# Patient Record
Sex: Male | Born: 1990 | Race: Black or African American | Hispanic: No | Marital: Single | Smoking: Never smoker
Health system: Southern US, Community
[De-identification: ages and names within clinical notes are randomized; demographics above are authoritative.]

## PROBLEM LIST (undated history)

## (undated) DIAGNOSIS — Z969 Presence of functional implant, unspecified: Secondary | ICD-10-CM

## (undated) DIAGNOSIS — J45909 Unspecified asthma, uncomplicated: Secondary | ICD-10-CM

## (undated) SURGERY — Surgical Case
Anesthesia: *Unknown

---
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
## 2008-05-25 ENCOUNTER — Emergency Department (HOSPITAL_COMMUNITY): Admission: EM | Admit: 2008-05-25 | Discharge: 2008-05-25 | Payer: Self-pay | Admitting: Emergency Medicine

## 2009-03-09 ENCOUNTER — Emergency Department (HOSPITAL_COMMUNITY): Admission: EM | Admit: 2009-03-09 | Discharge: 2009-03-09 | Payer: Self-pay | Admitting: Emergency Medicine

## 2009-08-01 ENCOUNTER — Emergency Department (HOSPITAL_COMMUNITY): Admission: EM | Admit: 2009-08-01 | Discharge: 2009-08-01 | Payer: Self-pay | Admitting: Emergency Medicine

## 2010-07-09 LAB — CULTURE, ROUTINE-ABSCESS

## 2011-12-27 ENCOUNTER — Encounter (HOSPITAL_COMMUNITY): Payer: Self-pay | Admitting: Emergency Medicine

## 2011-12-27 ENCOUNTER — Emergency Department (INDEPENDENT_AMBULATORY_CARE_PROVIDER_SITE_OTHER): Payer: Self-pay

## 2011-12-27 ENCOUNTER — Emergency Department (INDEPENDENT_AMBULATORY_CARE_PROVIDER_SITE_OTHER)
Admission: EM | Admit: 2011-12-27 | Discharge: 2011-12-27 | Disposition: A | Discharge status: Home / Self Care | Payer: Self-pay | Source: Home / Self Care | Attending: Emergency Medicine | Admitting: Emergency Medicine

## 2011-12-27 DIAGNOSIS — S62309A Unspecified fracture of unspecified metacarpal bone, initial encounter for closed fracture: Secondary | ICD-10-CM

## 2011-12-27 DIAGNOSIS — S62307A Unspecified fracture of fifth metacarpal bone, left hand, initial encounter for closed fracture: Secondary | ICD-10-CM

## 2011-12-27 HISTORY — DX: Unspecified asthma, uncomplicated: J45.909

## 2011-12-27 MED ORDER — HYDROCODONE-ACETAMINOPHEN 5-325 MG PO TABS: 2.0000 | ORAL_TABLET | ORAL | Status: DC | PRN

## 2011-12-27 MED ORDER — IBUPROFEN 800 MG PO TABS: 800.0000 mg | ORAL_TABLET | Freq: Three times a day (TID) | ORAL | Status: DC | PRN

## 2011-12-27 MED ORDER — IBUPROFEN 800 MG PO TABS
800.0000 mg | ORAL_TABLET | Freq: Once | ORAL | Status: AC
  Administered 2011-12-27: 800 mg via ORAL

## 2011-12-27 MED ORDER — IBUPROFEN 800 MG PO TABS
ORAL_TABLET | ORAL | Status: AC
  Filled 2011-12-27: qty 1

## 2011-12-27 NOTE — ED Provider Notes (Signed)
History     CSN: [id number]  Arrival date & time 12/27/11  1424   First MD Initiated Contact with Patient 12/27/11 1424      Chief Complaint  Patient presents with  . Hand Injury    (Consider location/radiation/quality/duration/timing/severity/associated sxs/prior treatment) HPI Comments: Right-handed male states he punched a wall with his left hand earlier today, now reports pain, swelling, bruising lateral left hand at the fifth MCP joint. Reports difficulty fully extending his little finger due to pain. No nausea, vomiting, numbness. He is not a smoker or diabetic.  ROS as noted in HPI. All other ROS negative.   Patient is a 21 y.o. male presenting with hand injury. The history is provided by the patient. No language interpreter was used.  Hand Injury  The incident occurred 3 to 5 hours ago. The incident occurred at home. The injury mechanism was a direct blow. The pain is present in the left hand. The quality of the pain is described as aching and throbbing. The pain is moderate. The pain has been constant since the incident. Pertinent negatives include no fever. He reports no foreign bodies present. The symptoms are aggravated by movement and use. He has tried nothing for the symptoms. The treatment provided no relief.    Past Medical History  Diagnosis Date  . Asthma     History reviewed. No pertinent past surgical history.  History reviewed. No pertinent family history.  History  Substance Use Topics  . Smoking status: Never Smoker   . Smokeless tobacco: Not on file  . Alcohol Use: Yes      Review of Systems  Constitutional: Negative for fever.    Allergies  Review of patient's allergies indicates no known allergies.  Home Medications   Current Outpatient Rx  Name Route Sig Dispense Refill  . HYDROCODONE-ACETAMINOPHEN 5-325 MG PO TABS Oral Take 2 tablets by mouth every 4 (four) hours as needed for pain. 20 tablet 0  . IBUPROFEN 800 MG PO TABS Oral Take 1  tablet (800 mg total) by mouth every 8 (eight) hours as needed for pain or fever. 20 tablet 0    BP 132/82  Pulse 80  Temp 99.3 F (37.4 C) (Oral)  Resp 18  SpO2 98%  Physical Exam  Nursing note and vitals reviewed. Constitutional: He is oriented to person, place, and time. He appears well-developed and well-nourished.  HENT:  Head: Normocephalic and atraumatic.  Eyes: Conjunctivae normal and EOM are normal.  Neck: Normal range of motion.  Cardiovascular: Normal rate.   Pulmonary/Chest: Effort normal. No respiratory distress.  Abdominal: He exhibits no distension.  Musculoskeletal: Normal range of motion.       No rotational deformity little finger. Bruising, swelling, fourth and fifth MCP joints. Unable to fully extend little finger due to pain. Proximal, middle, distal phalanx little finger nontender, FDP/FDS intact against resistance. distal motor and sensation in median/radial/ulnar nerve distribution with CR< 2 secs and pulse intact.   2-point discrimination intact at 5mm in all fingers. Skin intact. Wrist WNL.   Neurological: He is alert and oriented to person, place, and time. Coordination normal.  Skin: Skin is warm and dry.  Psychiatric: He has a normal mood and affect. His behavior is normal. Judgment and thought content normal.    ED Course  Procedures (including critical care time)  Labs Reviewed - No data to display Dg Hand Complete Left  12/27/2011  *RADIOLOGY REPORT*  Clinical Data: Punching injury  LEFT HAND - COMPLETE 3+  VIEW  Comparison: None.  Findings: Three views of the left hand submitted.  There is mild displaced angulated fracture distal aspect left fifth metacarpal. There is adjacent soft tissue swelling.  IMPRESSION: Mild displaced fracture distal aspect left fifth metacarpal.   Original Report Authenticated By: Natasha Mead, M.D.      1. Fracture of fifth metacarpal bone of left hand      MDM  X-ray reviewed by myself. Boxer's fracture. Full report per  radiologist. Pt NVI today. Discussed imaging findings the patient. Placing on a gutter splint, pain medicine, will have him followup with Dr. Izora Ribas, hand on call within the week. Emphasized importance of followup. Discussed signs and symptoms that should prompt has returned to the department. Patient agrees with plan.  Luiz Blare, MD 12/27/11 [phone]

## 2011-12-27 NOTE — ED Notes (Signed)
Pt is here c/o left hand inj.... Was upset/mad and punched a wooden door.... Sx include: limited movement, swelling, pain.

## 2011-12-27 NOTE — Progress Notes (Signed)
Orthopedic Tech Progress Note Patient Details:  Michael Hood 25-Jan-1991 [id number]  Ortho Devices Type of Ortho Device: Ulna gutter splint Ortho Device/Splint Location: left hand Ortho Device/Splint Interventions: Application   Suellen Durocher 12/27/2011, 4:18 PM

## 2011-12-27 NOTE — ED Notes (Signed)
Ortho Tech is here applying the Ulnar Gutter.

## 2011-12-27 NOTE — ED Notes (Signed)
Pt was preoccupied with phone call; EMT will return when pt is no longer on the phone to obtain vital signs.

## 2011-12-27 NOTE — ED Notes (Signed)
Went in to do assessment but pt was on his cell phone... Asked pt to hang the phone up but kept on talking... Will try later

## 2012-01-01 ENCOUNTER — Other Ambulatory Visit: Payer: Self-pay | Admitting: Orthopedic Surgery

## 2012-01-01 ENCOUNTER — Encounter (HOSPITAL_BASED_OUTPATIENT_CLINIC_OR_DEPARTMENT_OTHER): Payer: Self-pay | Admitting: *Deleted

## 2012-01-05 ENCOUNTER — Encounter (HOSPITAL_BASED_OUTPATIENT_CLINIC_OR_DEPARTMENT_OTHER): Payer: Self-pay | Admitting: Orthopedic Surgery

## 2012-01-05 ENCOUNTER — Ambulatory Visit (HOSPITAL_BASED_OUTPATIENT_CLINIC_OR_DEPARTMENT_OTHER)
Admission: RE | Admit: 2012-01-05 | Discharge: 2012-01-05 | Disposition: A | Discharge status: Home / Self Care | Payer: 59 | Source: Ambulatory Visit | Attending: Orthopedic Surgery | Admitting: Orthopedic Surgery

## 2012-01-05 ENCOUNTER — Ambulatory Visit (HOSPITAL_BASED_OUTPATIENT_CLINIC_OR_DEPARTMENT_OTHER): Payer: 59 | Admitting: Anesthesiology

## 2012-01-05 ENCOUNTER — Encounter (HOSPITAL_BASED_OUTPATIENT_CLINIC_OR_DEPARTMENT_OTHER)
Admission: RE | Disposition: A | Discharge status: Home / Self Care | Payer: Self-pay | Source: Ambulatory Visit | Attending: Orthopedic Surgery

## 2012-01-05 ENCOUNTER — Encounter (HOSPITAL_BASED_OUTPATIENT_CLINIC_OR_DEPARTMENT_OTHER): Payer: Self-pay | Admitting: Anesthesiology

## 2012-01-05 DIAGNOSIS — Y92009 Unspecified place in unspecified non-institutional (private) residence as the place of occurrence of the external cause: Secondary | ICD-10-CM | POA: Insufficient documentation

## 2012-01-05 DIAGNOSIS — S62309A Unspecified fracture of unspecified metacarpal bone, initial encounter for closed fracture: Secondary | ICD-10-CM | POA: Insufficient documentation

## 2012-01-05 DIAGNOSIS — J45909 Unspecified asthma, uncomplicated: Secondary | ICD-10-CM | POA: Insufficient documentation

## 2012-01-05 DIAGNOSIS — F17200 Nicotine dependence, unspecified, uncomplicated: Secondary | ICD-10-CM | POA: Insufficient documentation

## 2012-01-05 DIAGNOSIS — W2209XA Striking against other stationary object, initial encounter: Secondary | ICD-10-CM | POA: Insufficient documentation

## 2012-01-05 HISTORY — PX: PERCUTANEOUS PINNING METACARPAL FRACTURE: SUR206

## 2012-01-05 LAB — POCT HEMOGLOBIN-HEMACUE: Hemoglobin: 14 g/dL (ref 13.0–17.0)

## 2012-01-05 SURGERY — CLOSED REDUCTION, FRACTURE, METACARPAL BONE, WITH PERCUTANEOUS PINNING
Anesthesia: General | Site: Hand | Laterality: Left | Wound class: Clean

## 2012-01-05 MED ORDER — CHLORHEXIDINE GLUCONATE 4 % EX LIQD
60.0000 mL | Freq: Once | CUTANEOUS | Status: AC
  Administered 2012-01-05: 4 via TOPICAL

## 2012-01-05 MED ORDER — MIDAZOLAM HCL 2 MG/2ML IJ SOLN
1.0000 mg | INTRAMUSCULAR | Status: DC | PRN
  Administered 2012-01-05: 2 mg via INTRAVENOUS

## 2012-01-05 MED ORDER — CHLORHEXIDINE GLUCONATE 4 % EX LIQD: 60.0000 mL | Freq: Once | CUTANEOUS | Status: DC

## 2012-01-05 MED ORDER — FENTANYL CITRATE 0.05 MG/ML IJ SOLN
50.0000 ug | INTRAMUSCULAR | Status: DC | PRN
  Administered 2012-01-05: 100 ug via INTRAVENOUS

## 2012-01-05 MED ORDER — CEFAZOLIN SODIUM-DEXTROSE 2-3 GM-% IV SOLR
2.0000 g | INTRAVENOUS | Status: AC
  Administered 2012-01-05: 2 g via INTRAVENOUS

## 2012-01-05 MED ORDER — LACTATED RINGERS IV SOLN
INTRAVENOUS | Status: DC
  Administered 2012-01-05 (×2): via INTRAVENOUS

## 2012-01-05 MED ORDER — BUPIVACAINE-EPINEPHRINE PF 0.5-1:200000 % IJ SOLN
INTRAMUSCULAR | Status: DC | PRN
  Administered 2012-01-05: 150 mg

## 2012-01-05 MED ORDER — PROPOFOL 10 MG/ML IV BOLUS
INTRAVENOUS | Status: DC | PRN
  Administered 2012-01-05: 200 mg via INTRAVENOUS

## 2012-01-05 MED ORDER — OXYCODONE-ACETAMINOPHEN 5-325 MG PO TABS: 1.0000 | ORAL_TABLET | ORAL | Status: DC | PRN

## 2012-01-05 MED ORDER — FENTANYL CITRATE 0.05 MG/ML IJ SOLN
INTRAMUSCULAR | Status: DC | PRN
  Administered 2012-01-05: 25 ug via INTRAVENOUS

## 2012-01-05 MED ORDER — DEXAMETHASONE SODIUM PHOSPHATE 4 MG/ML IJ SOLN
INTRAMUSCULAR | Status: DC | PRN
  Administered 2012-01-05: 10 mg via INTRAVENOUS

## 2012-01-05 SURGICAL SUPPLY — 46 items
BANDAGE GAUZE ELAST BULKY 4 IN (GAUZE/BANDAGES/DRESSINGS) ×2 IMPLANT
BLADE MINI RND TIP GREEN BEAV (BLADE) IMPLANT
BLADE SURG 15 STRL LF DISP TIS (BLADE) IMPLANT
BLADE SURG 15 STRL SS (BLADE)
BNDG COHESIVE 3X5 TAN STRL LF (GAUZE/BANDAGES/DRESSINGS) ×2 IMPLANT
BNDG ESMARK 4X9 LF (GAUZE/BANDAGES/DRESSINGS) IMPLANT
CHLORAPREP W/TINT 26ML (MISCELLANEOUS) ×2 IMPLANT
CLOTH BEACON ORANGE TIMEOUT ST (SAFETY) ×2 IMPLANT
CORDS BIPOLAR (ELECTRODE) IMPLANT
COVER MAYO STAND STRL (DRAPES) ×2 IMPLANT
COVER TABLE BACK 60X90 (DRAPES) ×2 IMPLANT
CUFF TOURNIQUET SINGLE 18IN (TOURNIQUET CUFF) ×2 IMPLANT
DECANTER SPIKE VIAL GLASS SM (MISCELLANEOUS) IMPLANT
DRAPE EXTREMITY T 121X128X90 (DRAPE) ×2 IMPLANT
DRAPE OEC MINIVIEW 54X84 (DRAPES) ×2 IMPLANT
DRAPE SURG 17X23 STRL (DRAPES) ×2 IMPLANT
GAUZE XEROFORM 1X8 LF (GAUZE/BANDAGES/DRESSINGS) ×2 IMPLANT
GLOVE BIO SURGEON STRL SZ 6.5 (GLOVE) ×4 IMPLANT
GLOVE BIOGEL PI IND STRL 8.5 (GLOVE) ×1 IMPLANT
GLOVE BIOGEL PI INDICATOR 8.5 (GLOVE) ×1
GLOVE SURG ORTHO 8.0 STRL STRW (GLOVE) ×2 IMPLANT
GOWN BRE IMP PREV XXLGXLNG (GOWN DISPOSABLE) ×2 IMPLANT
GOWN PREVENTION PLUS XLARGE (GOWN DISPOSABLE) ×4 IMPLANT
NEEDLE 27GAX1X1/2 (NEEDLE) IMPLANT
NS IRRIG 1000ML POUR BTL (IV SOLUTION) IMPLANT
PACK BASIN DAY SURGERY FS (CUSTOM PROCEDURE TRAY) ×2 IMPLANT
PAD CAST 3X4 CTTN HI CHSV (CAST SUPPLIES) ×1 IMPLANT
PADDING CAST ABS 4INX4YD NS (CAST SUPPLIES) ×1
PADDING CAST ABS COTTON 4X4 ST (CAST SUPPLIES) ×1 IMPLANT
PADDING CAST COTTON 3X4 STRL (CAST SUPPLIES) ×1
SLEEVE SCD COMPRESS KNEE MED (MISCELLANEOUS) IMPLANT
SPLINT PLASTER CAST XFAST 3X15 (CAST SUPPLIES) IMPLANT
SPLINT PLASTER XTRA FASTSET 3X (CAST SUPPLIES)
SPONGE GAUZE 4X4 12PLY (GAUZE/BANDAGES/DRESSINGS) ×2 IMPLANT
STOCKINETTE 4X48 STRL (DRAPES) ×2 IMPLANT
SUT CHROMIC 5 0 P 3 (SUTURE) IMPLANT
SUT MERSILENE 4 0 P 3 (SUTURE) IMPLANT
SUT VICRYL 4-0 PS2 18IN ABS (SUTURE) IMPLANT
SUT VICRYL RAPID 5 0 P 3 (SUTURE) IMPLANT
SUT VICRYL RAPIDE 4/0 PS 2 (SUTURE) ×2 IMPLANT
SYR BULB 3OZ (MISCELLANEOUS) IMPLANT
SYR CONTROL 10ML LL (SYRINGE) IMPLANT
TOWEL OR 17X24 6PK STRL BLUE (TOWEL DISPOSABLE) ×2 IMPLANT
UNDERPAD 30X30 INCONTINENT (UNDERPADS AND DIAPERS) ×2 IMPLANT
WATER STERILE IRR 1000ML POUR (IV SOLUTION) IMPLANT
small bone fixation (Nail) ×2 IMPLANT

## 2012-01-05 NOTE — Anesthesia Postprocedure Evaluation (Signed)
Anesthesia Post Note  Patient: Michael Hood  Procedure(s) Performed: Procedure(s) (LRB): CLOSED REDUCTION METACARPAL WITH PERCUTANEOUS PINNING (Left)  Anesthesia type: general  Patient location: PACU  Post pain: Pain level controlled  Post assessment: Patient's Cardiovascular Status Stable  Last Vitals:  Filed Vitals:   01/05/12 1700  BP:   Pulse: 79  Temp:   Resp: 13    Post vital signs: Reviewed and stable  Level of consciousness: sedated  Complications: No apparent anesthesia complications

## 2012-01-05 NOTE — H&P (Signed)
  Michael Hood is a 21 year old right hand dominant male who punched a door on 12-27-11 suffering an injury to his right small finger metacarpal. He has no prior history of injury. He was seen at the ER where x-rays were taken. He was placed in an ulnar gutter splint and given pain medication to take and referred. No history of diabetes, thyroid problems, arthritis or gout. He complains of an intermittent severe, sharp, throbbing type pain which is improving. Activity makes it worse and rest makes it better. He has been taking ibuprofen and Hydrocodone.   PAST MEDICAL HISTORY: He has no allergies. He is currently taking no medications. He has had no surgery.  FAMILY H ISTORY: Positive for arthritis, otherwise negative.  SOCIAL HISTORY: He does not smoke. He drinks socially. 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 Michael Hood is an 21 y.o. male.   Chief Complaint: Fracture 5th metacarpal left HPI:  See above  Past Medical History  Diagnosis Date  . Asthma     as a child  . Fracture, metacarpal 12/27/2011    left 5th    Past Surgical History  Procedure Date  . No past surgeries     History reviewed. No pertinent family history. Social History:  reports that he has never smoked. He has never used smokeless tobacco. He reports that he drinks alcohol. He reports that he does not use illicit drugs.  Allergies: No Known Allergies  No prescriptions prior to admission    No results found for this or any previous visit (from the past 48 hour(s)).  No results found.   Pertinent items are noted in HPI.  Height 5\' 8"  (1.727 m), weight 65.772 kg (145 lb).  General appearance: alert, cooperative and appears stated age Head: Normocephalic, without obvious abnormality Neck: no adenopathy Resp: clear to auscultation bilaterally Cardio: regular rate and rhythm, S1, S2 normal, no murmur, click, rub or  gallop GI: soft, non-tender; bowel sounds normal; no masses,  no organomegaly Extremities: extremities normal, atraumatic, no cyanosis or edema Pulses: 2+ and symmetric Skin: Skin color, texture, turgor normal. No rashes or lesions Neurologic: Grossly normal Incision/Wound: na  Assessment/Plan X-rays reveal a 5th metacarpal neck fracture with approximately 45-50 degrees of angulation.   We have discussed closed vs open treatment for him. He has elected to undergo surgical intervention with closed, possible ORIF. We will attempt to put an IM rod in this in an effort to see if this will resolve this for him. He is advised that pins may be necessary or even ORIF. The pre, peri and post op course are discussed along with risks and complications.  He is aware there is no guarantee with surgery, possibility of infection, recurrence, injury to arteries, nerves and tendons, incomplete relief of symptoms, nonunion, loss of fixation and dystrophy. He would like to proceed and this will be scheduled as an outpatient for closed possible ORIF left 5th metacarpal fracture.  Michael Hood R 01/05/2012, 2:11 PM

## 2012-01-05 NOTE — Brief Op Note (Signed)
01/05/2012  4:19 PM  PATIENT:  Michael Hood  21 y.o. male  PRE-OPERATIVE DIAGNOSIS:  Fracture 5TH METACARPAL LEFT   POST-OPERATIVE DIAGNOSIS:  fracture 5th metacarpal left  PROCEDURE:  Procedure(s) (LRB) with comments: CLOSED REDUCTION METACARPAL WITH PERCUTANEOUS PINNING (Left) - CLOSED PINNING LEFT 5TH METACARPAL  SURGEON:  Surgeon(s) and Role:    * Nicki Reaper, MD - Primary  PHYSICIAN ASSISTANT:   ASSISTANTS: none   ANESTHESIA:   regional and general  EBL:     BLOOD ADMINISTERED:none  DRAINS: none   LOCAL MEDICATIONS USED:  NONE  SPECIMEN:  No Specimen  DISPOSITION OF SPECIMEN:  N/A  COUNTS:  YES  TOURNIQUET:   Total Tourniquet Time Documented: Upper Arm (Left) - 14 minutes  DICTATION: .Other Dictation: Dictation Number [id number]  PLAN OF CARE: Discharge to home after PACU  PATIENT DISPOSITION:  PACU - hemodynamically stable.

## 2012-01-05 NOTE — Progress Notes (Signed)
Assisted Dr. Singer with left, ultrasound guided, supraclavicular block. Side rails up, monitors on throughout procedure. See vital signs in flow sheet. Tolerated Procedure well. 

## 2012-01-05 NOTE — Anesthesia Preprocedure Evaluation (Signed)
Anesthesia Evaluation  Patient identified by MRN, date of birth, ID band Patient awake    Reviewed: Allergy & Precautions, H&P , NPO status , Patient's Chart, lab work & pertinent test results  Airway Mallampati: I TM Distance: >3 FB Neck ROM: full    Dental  (+) Teeth Intact and Dental Advidsory Given   Pulmonary asthma , Current Smoker,  breath sounds clear to auscultation  Pulmonary exam normal       Cardiovascular     Neuro/Psych    GI/Hepatic negative GI ROS, Neg liver ROS,   Endo/Other  negative endocrine ROS  Renal/GU negative Renal ROS     Musculoskeletal   Abdominal Normal abdominal exam  (+)   Peds  Hematology negative hematology ROS (+)   Anesthesia Other Findings   Reproductive/Obstetrics                           Anesthesia Physical Anesthesia Plan  ASA: II  Anesthesia Plan: General LMA   Post-op Pain Management: MAC Combined w/ Regional for Post-op pain   Induction:   Airway Management Planned:   Additional Equipment:   Intra-op Plan:   Post-operative Plan:   Informed Consent: I have reviewed the patients History and Physical, chart, labs and discussed the procedure including the risks, benefits and alternatives for the proposed anesthesia with the patient or authorized representative who has indicated his/her understanding and acceptance.   Dental Advisory Given  Plan Discussed with: Anesthesiologist, CRNA and Surgeon  Anesthesia Plan Comments:         Anesthesia Quick Evaluation

## 2012-01-05 NOTE — Anesthesia Procedure Notes (Addendum)
Anesthesia Regional Block:  Supraclavicular block  Pre-Anesthetic Checklist: ,, timeout performed, Correct Patient, Correct Site, Correct Laterality, Correct Procedure,, site marked, risks and benefits discussed, Surgical consent,  Pre-op evaluation,  At surgeon's request and post-op pain management  Laterality: Left  Prep: chloraprep       Needles:  Injection technique: Single-shot  Needle Type: Echogenic Stimulator Needle     Needle Length: 5cm 5 cm Needle Gauge: 22 and 22 G    Additional Needles:  Procedures: ultrasound guided and nerve stimulator Supraclavicular block  Nerve Stimulator or Paresthesia:  Response: bicep contraction, 0.45 mA,   Additional Responses:   Narrative:  Start time: 01/05/2012 2:49 PM End time: 01/05/2012 3:00 PM Injection made incrementally with aspirations every 5 mL.  Performed by: Personally  Anesthesiologist: J. Adonis Huguenin, MD  Additional Notes: Functioning IV was confirmed and monitors applied.  A 50mm 22ga echogenic arrow stimulator was used. Sterile prep and drape,hand hygiene and sterile gloves were used.Ultrasound guidance: relevant anatomy identified, needle position confirmed, local anesthetic spread visualized around nerve(s)., vascular puncture avoided.  Image printed for medical record.  Negative aspiration and negative test dose prior to incremental administration of local anesthetic. The patient tolerated the procedure well.  Interscalene brachial plexus block Procedure Name: LMA Insertion Date/Time: 01/05/2012 3:52 PM Performed by: Gar Gibbon Pre-anesthesia Checklist: Patient identified, Emergency Drugs available, Suction available and Patient being monitored Patient Re-evaluated:Patient Re-evaluated prior to inductionOxygen Delivery Method: Circle System Utilized Preoxygenation: Pre-oxygenation with 100% oxygen Intubation Type: IV induction Ventilation: Mask ventilation without difficulty LMA: LMA inserted LMA Size:  4.0 Number of attempts: 1 Airway Equipment and Method: bite block Placement Confirmation: positive ETCO2 Tube secured with: Tape Dental Injury: Teeth and Oropharynx as per pre-operative assessment

## 2012-01-05 NOTE — Op Note (Signed)
Dictated number: [id number]

## 2012-01-05 NOTE — Transfer of Care (Signed)
Immediate Anesthesia Transfer of Care Note  Patient: Michael Hood  Procedure(s) Performed: Procedure(s) (LRB) with comments: CLOSED REDUCTION METACARPAL WITH PERCUTANEOUS PINNING (Left) - CLOSED PINNING LEFT 5TH METACARPAL  Patient Location: PACU  Anesthesia Type: GA combined with regional for post-op pain  Level of Consciousness: sedated  Airway & Oxygen Therapy: Patient Spontanous Breathing and Patient connected to face mask oxygen  Post-op Assessment: Report given to PACU RN and Post -op Vital signs reviewed and stable  Post vital signs: Reviewed and stable  Complications: No apparent anesthesia complications

## 2012-01-06 NOTE — Op Note (Signed)
Michael Hood, Michael Hood             ACCOUNT NO.:  [id number]  MEDICAL RECORD NO.:  [id number]  LOCATION:  [facility]                        FACILITY:  [facility]  PHYSICIAN:  Cindee Salt, M.D.       DATE OF BIRTH:  Jan 21, 1991  DATE OF PROCEDURE:  01/05/2012 DATE OF DISCHARGE:  12/27/2011                              OPERATIVE REPORT   PREOPERATIVE DIAGNOSIS:  Fractured fifth metacarpal, left hand.  POSTOPERATIVE DIAGNOSIS:  Fractured fifth metacarpal, left hand.  OPERATION:  Percutaneous fixation of 5th metacarpal with IM rod, left hand.  SURGEON:  Cindee Salt, M.D.  ASSISTANT:  None.  ANESTHESIA:  Supraclavicular block, general.  ANESTHESIOLOGIST:  Quita Skye. Krista Blue, M.D.  HISTORY:  The patient is a 21 year old male who suffered a fracture of his fifth metacarpal, left hand.  He has elected to have this fixed.  We have suggested an intramedullary rod.  Pre, peri, and postoperative course have been discussed along with risks and complications.  He is aware there is no guarantee with the surgery, possibility of infection; recurrence of injury to arteries, nerves, tendons, incomplete relief of symptoms, and dystrophy.  In the preoperative area, the patient is seen, the extremity marked by both the patient and surgeon.  Antibiotic given.  PROCEDURE:  The patient was brought to the operating room where a supraclavicular block was carried out without difficulty.  He was prepped using ChloraPrep, supine position with the left arm free.  The fracture was reduced under image intensification.  After exsanguination of the limb with an Esmarch bandage, a separate incision was then made over the proximal metacarpal shaft base, this was done with image intensification for localization, and carried down to the base.  A drill hole was made with 0.62 K-wire.  Two intramedullary rod was then selected, this was passed up into the metacarpal head firmly fixing it in position.  This was bent and cut short.   The x-rays were taken in AP, lateral, and oblique directions revealing that the fracture was well reduced.  Skin was then closed with interrupted 4-0 Vicryl Rapide sutures.  Sterile compressive dressing, ulnar gutter splint was applied including the ring and little finger.  On deflation of the tourniquet, all fingers immediately pinked.  He was taken to the recovery room for observation in satisfactory condition.  He will be discharged home to return to the [facility] in 1 week on Percocet.          ______________________________ Cindee Salt, M.D.     GK/MEDQ  D:  01/05/2012  T:  01/06/2012  Job:  [id number]

## 2012-03-07 DIAGNOSIS — Z969 Presence of functional implant, unspecified: Secondary | ICD-10-CM

## 2012-03-07 HISTORY — DX: Presence of functional implant, unspecified: Z96.9

## 2012-03-12 ENCOUNTER — Other Ambulatory Visit: Payer: Self-pay | Admitting: Orthopedic Surgery

## 2012-03-15 ENCOUNTER — Encounter (HOSPITAL_BASED_OUTPATIENT_CLINIC_OR_DEPARTMENT_OTHER): Payer: Self-pay | Admitting: *Deleted

## 2012-03-17 ENCOUNTER — Ambulatory Visit (HOSPITAL_BASED_OUTPATIENT_CLINIC_OR_DEPARTMENT_OTHER): Payer: 59 | Admitting: Anesthesiology

## 2012-03-17 ENCOUNTER — Ambulatory Visit (HOSPITAL_BASED_OUTPATIENT_CLINIC_OR_DEPARTMENT_OTHER)
Admission: RE | Admit: 2012-03-17 | Discharge: 2012-03-17 | Disposition: A | Discharge status: Home / Self Care | Payer: 59 | Source: Ambulatory Visit | Attending: Orthopedic Surgery | Admitting: Orthopedic Surgery

## 2012-03-17 ENCOUNTER — Encounter (HOSPITAL_BASED_OUTPATIENT_CLINIC_OR_DEPARTMENT_OTHER): Payer: Self-pay | Admitting: Anesthesiology

## 2012-03-17 ENCOUNTER — Encounter (HOSPITAL_BASED_OUTPATIENT_CLINIC_OR_DEPARTMENT_OTHER): Payer: Self-pay | Admitting: *Deleted

## 2012-03-17 ENCOUNTER — Encounter (HOSPITAL_BASED_OUTPATIENT_CLINIC_OR_DEPARTMENT_OTHER)
Admission: RE | Disposition: A | Discharge status: Home / Self Care | Payer: Self-pay | Source: Ambulatory Visit | Attending: Orthopedic Surgery

## 2012-03-17 DIAGNOSIS — Z472 Encounter for removal of internal fixation device: Secondary | ICD-10-CM | POA: Insufficient documentation

## 2012-03-17 HISTORY — PX: HARDWARE REMOVAL: SHX979

## 2012-03-17 HISTORY — DX: Presence of functional implant, unspecified: Z96.9

## 2012-03-17 SURGERY — REMOVAL, HARDWARE
Anesthesia: General | Site: Hand | Laterality: Left

## 2012-03-17 MED ORDER — ONDANSETRON HCL 4 MG/2ML IJ SOLN
INTRAMUSCULAR | Status: DC | PRN
  Administered 2012-03-17: 4 mg via INTRAVENOUS

## 2012-03-17 MED ORDER — BUPIVACAINE HCL (PF) 0.25 % IJ SOLN
INTRAMUSCULAR | Status: DC | PRN
  Administered 2012-03-17: 2 mL

## 2012-03-17 MED ORDER — FENTANYL CITRATE 0.05 MG/ML IJ SOLN
INTRAMUSCULAR | Status: DC | PRN
  Administered 2012-03-17 (×2): 50 ug via INTRAVENOUS

## 2012-03-17 MED ORDER — HYDROMORPHONE HCL PF 1 MG/ML IJ SOLN
0.2500 mg | INTRAMUSCULAR | Status: DC | PRN
Start: 2012-03-17 — End: 2012-03-17

## 2012-03-17 MED ORDER — HYDROCODONE-ACETAMINOPHEN 5-325 MG PO TABS: 1.0000 | ORAL_TABLET | Freq: Four times a day (QID) | ORAL | Status: DC | PRN

## 2012-03-17 MED ORDER — OXYCODONE HCL 5 MG/5ML PO SOLN: 5.0000 mg | Freq: Once | ORAL | Status: AC | PRN

## 2012-03-17 MED ORDER — LACTATED RINGERS IV SOLN
INTRAVENOUS | Status: DC
  Administered 2012-03-17: 10:00:00 via INTRAVENOUS

## 2012-03-17 MED ORDER — PROMETHAZINE HCL 25 MG/ML IJ SOLN: 6.2500 mg | INTRAMUSCULAR | Status: DC | PRN

## 2012-03-17 MED ORDER — PROPOFOL 10 MG/ML IV BOLUS
INTRAVENOUS | Status: DC | PRN
  Administered 2012-03-17: 200 mg via INTRAVENOUS

## 2012-03-17 MED ORDER — MIDAZOLAM HCL 2 MG/2ML IJ SOLN: 0.5000 mg | Freq: Once | INTRAMUSCULAR | Status: DC | PRN

## 2012-03-17 MED ORDER — DEXAMETHASONE SODIUM PHOSPHATE 10 MG/ML IJ SOLN
INTRAMUSCULAR | Status: DC | PRN
  Administered 2012-03-17: 10 mg via INTRAVENOUS

## 2012-03-17 MED ORDER — OXYCODONE HCL 5 MG PO TABS
5.0000 mg | ORAL_TABLET | Freq: Once | ORAL | Status: AC | PRN
  Administered 2012-03-17: 5 mg via ORAL

## 2012-03-17 MED ORDER — CHLORHEXIDINE GLUCONATE 4 % EX LIQD: 60.0000 mL | Freq: Once | CUTANEOUS | Status: DC

## 2012-03-17 MED ORDER — LIDOCAINE HCL (CARDIAC) 20 MG/ML IV SOLN
INTRAVENOUS | Status: DC | PRN
  Administered 2012-03-17: 40 mg via INTRAVENOUS

## 2012-03-17 MED ORDER — MEPERIDINE HCL 25 MG/ML IJ SOLN: 6.2500 mg | INTRAMUSCULAR | Status: DC | PRN

## 2012-03-17 SURGICAL SUPPLY — 42 items
BANDAGE GAUZE ELAST BULKY 4 IN (GAUZE/BANDAGES/DRESSINGS) ×2 IMPLANT
BLADE SURG 15 STRL LF DISP TIS (BLADE) ×1 IMPLANT
BLADE SURG 15 STRL SS (BLADE) ×1
BNDG COHESIVE 3X5 TAN STRL LF (GAUZE/BANDAGES/DRESSINGS) ×2 IMPLANT
BNDG ESMARK 4X9 LF (GAUZE/BANDAGES/DRESSINGS) ×2 IMPLANT
CHLORAPREP W/TINT 26ML (MISCELLANEOUS) ×2 IMPLANT
CLOTH BEACON ORANGE TIMEOUT ST (SAFETY) ×2 IMPLANT
CORDS BIPOLAR (ELECTRODE) ×2 IMPLANT
COVER MAYO STAND STRL (DRAPES) ×2 IMPLANT
COVER TABLE BACK 60X90 (DRAPES) ×2 IMPLANT
CUFF TOURNIQUET SINGLE 18IN (TOURNIQUET CUFF) ×2 IMPLANT
DECANTER SPIKE VIAL GLASS SM (MISCELLANEOUS) IMPLANT
DRAPE EXTREMITY T 121X128X90 (DRAPE) ×2 IMPLANT
DRAPE OEC MINIVIEW 54X84 (DRAPES) IMPLANT
DRAPE SURG 17X23 STRL (DRAPES) ×2 IMPLANT
GAUZE XEROFORM 1X8 LF (GAUZE/BANDAGES/DRESSINGS) ×2 IMPLANT
GLOVE BIO SURGEON STRL SZ 6.5 (GLOVE) ×2 IMPLANT
GLOVE BIOGEL PI IND STRL 8 (GLOVE) ×1 IMPLANT
GLOVE BIOGEL PI IND STRL 8.5 (GLOVE) ×1 IMPLANT
GLOVE BIOGEL PI INDICATOR 8 (GLOVE) ×1
GLOVE BIOGEL PI INDICATOR 8.5 (GLOVE) ×1
GLOVE SURG ORTHO 8.0 STRL STRW (GLOVE) ×2 IMPLANT
GOWN BRE IMP PREV XXLGXLNG (GOWN DISPOSABLE) ×2 IMPLANT
GOWN PREVENTION PLUS XLARGE (GOWN DISPOSABLE) ×2 IMPLANT
NS IRRIG 1000ML POUR BTL (IV SOLUTION) ×2 IMPLANT
PACK BASIN DAY SURGERY FS (CUSTOM PROCEDURE TRAY) ×2 IMPLANT
PADDING CAST ABS 3INX4YD NS (CAST SUPPLIES) ×1
PADDING CAST ABS COTTON 3X4 (CAST SUPPLIES) ×1 IMPLANT
SPLINT PLASTER CAST XFAST 3X15 (CAST SUPPLIES) IMPLANT
SPLINT PLASTER CAST XFAST 4X15 (CAST SUPPLIES) ×10 IMPLANT
SPLINT PLASTER XTRA FAST SET 4 (CAST SUPPLIES) ×10
SPLINT PLASTER XTRA FASTSET 3X (CAST SUPPLIES)
SPONGE GAUZE 4X4 12PLY (GAUZE/BANDAGES/DRESSINGS) ×2 IMPLANT
STOCKINETTE 4X48 STRL (DRAPES) ×2 IMPLANT
SUT CHROMIC 5 0 P 3 (SUTURE) IMPLANT
SUT VICRYL 4-0 PS2 18IN ABS (SUTURE) IMPLANT
SUT VICRYL RAPID 5 0 P 3 (SUTURE) IMPLANT
SUT VICRYL RAPIDE 4/0 PS 2 (SUTURE) ×2 IMPLANT
SYR BULB 3OZ (MISCELLANEOUS) ×2 IMPLANT
SYR CONTROL 10ML LL (SYRINGE) ×2 IMPLANT
UNDERPAD 30X30 INCONTINENT (UNDERPADS AND DIAPERS) ×2 IMPLANT
WATER STERILE IRR 1000ML POUR (IV SOLUTION) IMPLANT

## 2012-03-17 NOTE — H&P (Signed)
  Michael Hood is a 21 year old right hand dominant male who punched a door on 12-27-11 suffering an injury to his right small finger metacarpal. He has no prior history of injury. He was seen at the ER where x-rays were taken. He was placed in an ulnar gutter splint and given pain medication to take and referred. No history of diabetes, thyroid problems, arthritis or gout. He complains of an intermittent severe, sharp, throbbing type pain which is improving. Activity makes it worse and rest makes it better. He has been taking ibuprofen and Hydrocodone.  his 5th metacarpal fracture left hand, IM rod fixation has healed.    PAST MEDICAL HISTORY: He has no allergies. He is currently taking no medications. He has had no surgery.  FAMILY H ISTORY: Positive for arthritis, otherwise negative.  SOCIAL HISTORY: He does not smoke. He drinks socially. 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 Michael Hood is an 21 y.o. male.   Chief Complaint: S/P IM rod lt 5th metacarpal HPI: see above  Past Medical History  Diagnosis Date  . Asthma     as a child  . Retained orthopedic hardware 03/2012    left hand    Past Surgical History  Procedure Date  . Percutaneous pinning metacarpal fracture 01/05/2012    left 5th    History reviewed. No pertinent family history. Social History:  reports that he has never smoked. He has never used smokeless tobacco. He reports that he drinks alcohol. He reports that he does not use illicit drugs.  Allergies: No Known Allergies  No prescriptions prior to admission    No results found for this or any previous visit (from the past 48 hour(s)).  No results found.   Pertinent items are noted in HPI.  Blood pressure 106/75, pulse 70, temperature 98.1 F (36.7 C), temperature source Oral, resp. rate 16, height 5\' 9"  (1.753 m), weight 58.605 kg (129 lb 3.2 oz), SpO2 100.00%.  General  appearance: alert, cooperative and appears stated age Head: Normocephalic, without obvious abnormality Neck: no adenopathy Resp: clear to auscultation bilaterally Cardio: regular rate and rhythm, S1, S2 normal, no murmur, click, rub or gallop GI: soft, non-tender; bowel sounds normal; no masses,  no organomegaly Extremities: extremities normal, atraumatic, no cyanosis or edema Pulses: 2+ and symmetric Skin: Skin color, texture, turgor normal. No rashes or lesions Neurologic: Grossly normal Incision/Wound: na  Assessment/Plan We would recommend removal of the IM rod in that the fracture appears healed.  This will be doneas an outpatient under MAC.  Royanne Warshaw R 03/17/2012, 9:23 AM

## 2012-03-17 NOTE — Brief Op Note (Signed)
03/17/2012  10:57 AM  PATIENT:  Michael Hood  21 y.o. male  PRE-OPERATIVE DIAGNOSIS:  s/p IM Rod Left small finger metacarpal   POST-OPERATIVE DIAGNOSIS:  S/P Imtramedullary rod left small metacarpal  PROCEDURE:  Procedure(s) (LRB) with comments: HARDWARE REMOVAL (Left) - Removal IM Rod Left hand   SURGEON:  Surgeon(s) and Role:    * Nicki Reaper, MD - Primary  PHYSICIAN ASSISTANT:   ASSISTANTS: none   ANESTHESIA:   local and general  EBL:     BLOOD ADMINISTERED:none  DRAINS: none   LOCAL MEDICATIONS USED:  MARCAINE     SPECIMEN:  No Specimen  DISPOSITION OF SPECIMEN:  N/A  COUNTS:  YES  TOURNIQUET:   Total Tourniquet Time Documented: Upper Arm (Left) - 9 minutes  DICTATION: .Other Dictation: Dictation Number [id number]  PLAN OF CARE: Discharge to home after PACU  PATIENT DISPOSITION:  PACU - hemodynamically stable.

## 2012-03-17 NOTE — Anesthesia Procedure Notes (Signed)
Procedure Name: LMA Insertion Date/Time: 03/17/2012 10:32 AM Performed by: Burna Cash Pre-anesthesia Checklist: Patient identified, Emergency Drugs available, Suction available and Patient being monitored Patient Re-evaluated:Patient Re-evaluated prior to inductionOxygen Delivery Method: Circle System Utilized Preoxygenation: Pre-oxygenation with 100% oxygen Intubation Type: IV induction Ventilation: Mask ventilation without difficulty LMA: LMA inserted LMA Size: 4.0 Number of attempts: 1 Airway Equipment and Method: bite block Placement Confirmation: positive ETCO2 Tube secured with: Tape Dental Injury: Teeth and Oropharynx as per pre-operative assessment

## 2012-03-17 NOTE — Op Note (Signed)
Other Dictation: Dictation Number [id number]

## 2012-03-17 NOTE — Transfer of Care (Signed)
Immediate Anesthesia Transfer of Care Note  Patient: Michael Hood  Procedure(s) Performed: Procedure(s) (LRB) with comments: HARDWARE REMOVAL (Left) - Removal IM Rod Left hand   Patient Location: PACU  Anesthesia Type:General  Level of Consciousness: sedated  Airway & Oxygen Therapy: Patient Spontanous Breathing and Patient connected to face mask oxygen  Post-op Assessment: Report given to PACU RN and Post -op Vital signs reviewed and stable  Post vital signs: Reviewed and stable  Complications: No apparent anesthesia complications

## 2012-03-17 NOTE — Anesthesia Preprocedure Evaluation (Signed)
Anesthesia Evaluation  Patient identified by MRN, date of birth, ID band Patient awake    Reviewed: Allergy & Precautions, H&P , NPO status , Patient's Chart, lab work & pertinent test results  History of Anesthesia Complications Negative for: history of anesthetic complications  Airway Mallampati: I TM Distance: >3 FB Neck ROM: Full    Dental  (+) Missing, Dental Advisory Given and Chipped   Pulmonary asthma (childhood asthma) , former smoker,  breath sounds clear to auscultation  Pulmonary exam normal       Cardiovascular negative cardio ROS  Rhythm:Regular Rate:Normal     Neuro/Psych negative neurological ROS     GI/Hepatic negative GI ROS, Neg liver ROS,   Endo/Other  negative endocrine ROS  Renal/GU negative Renal ROS     Musculoskeletal   Abdominal   Peds  Hematology negative hematology ROS (+)   Anesthesia Other Findings   Reproductive/Obstetrics                           Anesthesia Physical Anesthesia Plan  ASA: II  Anesthesia Plan: General   Post-op Pain Management:    Induction: Intravenous  Airway Management Planned: LMA  Additional Equipment:   Intra-op Plan:   Post-operative Plan:   Informed Consent: I have reviewed the patients History and Physical, chart, labs and discussed the procedure including the risks, benefits and alternatives for the proposed anesthesia with the patient or authorized representative who has indicated his/her understanding and acceptance.   Dental advisory given  Plan Discussed with: CRNA and Surgeon  Anesthesia Plan Comments: (Plan routine monitors, GA- LMA OK)        Anesthesia Quick Evaluation

## 2012-03-17 NOTE — Anesthesia Postprocedure Evaluation (Signed)
  Anesthesia Post-op Note  Patient: Michael Hood  Procedure(s) Performed: Procedure(s) (LRB) with comments: HARDWARE REMOVAL (Left) - Removal IM Rod Left hand   Patient Location: PACU  Anesthesia Type:General  Level of Consciousness: awake, alert , oriented and patient cooperative  Airway and Oxygen Therapy: Patient Spontanous Breathing  Post-op Pain: none  Post-op Assessment: Post-op Vital signs reviewed, Patient's Cardiovascular Status Stable, Respiratory Function Stable, Patent Airway, No signs of Nausea or vomiting and Pain level controlled  Post-op Vital Signs: Reviewed and stable  Complications: No apparent anesthesia complications

## 2012-03-18 ENCOUNTER — Encounter (HOSPITAL_BASED_OUTPATIENT_CLINIC_OR_DEPARTMENT_OTHER): Payer: Self-pay | Admitting: Orthopedic Surgery

## 2012-03-18 LAB — POCT HEMOGLOBIN-HEMACUE: Hemoglobin: 14.6 g/dL (ref 13.0–17.0)

## 2012-03-18 NOTE — Op Note (Signed)
Michael Hood, Michael Hood             ACCOUNT NO.:  [id number]  MEDICAL RECORD NO.:  [id number]  LOCATION:                                 FACILITY:  PHYSICIAN:  Cindee Salt, M.D.       DATE OF BIRTH:  1990-05-31  DATE OF PROCEDURE:  03/17/2012 DATE OF DISCHARGE:                              OPERATIVE REPORT   PREOPERATIVE DIAGNOSIS:  Status post intramedullary rod internal fixation, metacarpal right little finger.  POSTOPERATIVE DIAGNOSIS:  Status post intramedullary rod internal fixation, metacarpal right little finger.  OPERATION:  Removal of IM rod, left 5th metacarpal.  SURGEON:  Cindee Salt, MD  ANESTHESIA:  General with local infiltration.  ANESTHESIOLOGISTJean Rosenthal.  HISTORY:  The patient is a 21 year old male with history of a displaced fracture of his 5th metacarpal. He underwent closed IM rodding of the 5th metacarpal, is admitted now for removal of the IM rod.  Pre, peri, and postoperative course have been discussed along with risks and complications.  He is aware of the possibility of infection, refracture, injury to arteries, nerves, tendons, incomplete relief of symptoms, dystrophy, stiffness.  In the preoperative area, the patient is seen, the extremity marked by both the patient and surgeon.  Antibiotic was not given.  PROCEDURE:  The patient was brought to the operating room and general anesthetic carried out without difficulty.  He was prepped using ChloraPrep, supine position, left arm free.  A 3-minute dry time was allowed.  Time-out taken, confirming patient and procedure.  The limb was exsanguinated with an Esmarch bandage.  Tourniquet was placed high and the arm was inflated to 250 mmHg.  The end of the rod was isolated and identified using image intensification.  A longitudinal incision was carried directly over the end of the IM rod, carried down through the subcutaneous tissue with blunt dissection.  The end of the rod was identified.  This was  removed without difficulty.  The wound was copiously irrigated with saline.  The skin closed with interrupted 4-0 Vicryl Rapide sutures.  Local infiltration with 0.25% Marcaine without epinephrine was given, approximately 3 mL was used.  A sterile compressive dressing and ulnar gutter splint to the middle ring and little finger was applied.  On deflation of the tourniquet, all fingers immediately pinked.  He was taken to the recovery room for observation in satisfactory condition.  He will be discharged home to return in 1 week on Vicodin.          ______________________________ Cindee Salt, M.D.     GK/MEDQ  D:  03/17/2012  T:  03/18/2012  Job:  [id number]

## 2013-05-02 ENCOUNTER — Ambulatory Visit (INDEPENDENT_AMBULATORY_CARE_PROVIDER_SITE_OTHER): Payer: 59 | Admitting: Podiatry

## 2013-05-02 ENCOUNTER — Encounter: Payer: Self-pay | Admitting: Podiatry

## 2013-05-02 ENCOUNTER — Ambulatory Visit (INDEPENDENT_AMBULATORY_CARE_PROVIDER_SITE_OTHER): Payer: 59

## 2013-05-02 VITALS — BP 120/74 | HR 76 | Resp 12 | Ht 69.0 in | Wt 130.0 lb

## 2013-05-02 DIAGNOSIS — R52 Pain, unspecified: Secondary | ICD-10-CM

## 2013-05-02 DIAGNOSIS — M779 Enthesopathy, unspecified: Secondary | ICD-10-CM

## 2013-05-02 DIAGNOSIS — M216X9 Other acquired deformities of unspecified foot: Secondary | ICD-10-CM

## 2013-05-02 DIAGNOSIS — Q828 Other specified congenital malformations of skin: Secondary | ICD-10-CM

## 2013-05-02 MED ORDER — TRIAMCINOLONE ACETONIDE 10 MG/ML IJ SUSP
10.0000 mg | Freq: Once | INTRAMUSCULAR | Status: AC
  Administered 2013-05-02: 10 mg

## 2013-05-02 NOTE — Progress Notes (Signed)
   Subjective:    Patient ID: Michael Hood, male    DOB: 06-Aug-1990, 23 y.o.   MRN: [id number]  HPI Comments: '' LT OUTSIDE OF THE FOOT IS HURTING.''     Review of Systems  Musculoskeletal: Positive for back pain and gait problem.  Skin: Positive for rash.  All other systems reviewed and are negative.       Objective:   Physical Exam        Assessment & Plan:

## 2013-05-02 NOTE — Progress Notes (Signed)
N-SORE, THICK SKIN L-LT FOOT D-3 YEARS O-SLOWLY C-WORSE A-PRESSURE T-N/A

## 2013-05-02 NOTE — Progress Notes (Signed)
Subjective:     Patient ID: Michael Hood, male   DOB: April 30, 1990, 23 y.o.   MRN: [id number]  HPI patient states I'm having a lot of pain in my left bone on the outside with a thick lesion that has formed and it has gotten worse over the last couple year   Review of Systems  All other systems reviewed and are negative.       Objective:   Physical Exam  Nursing note and vitals reviewed. Constitutional: He is oriented to person, place, and time.  Cardiovascular: Intact distal pulses.   Musculoskeletal: Normal range of motion.  Neurological: He is oriented to person, place, and time.  Skin: Skin is warm.   neurovascular status intact and normal muscle strength with no equinus condition noted or range of motion loss. Head of fifth metatarsal left is very inflamed and sore and there's keratotic tissue which is formed in a very thick fashion around the head of the bone     Assessment:     Plantarflexed metatarsal with keratotic lesion formation and capsular inflammation left fifth MPJ    Plan:     H&P and x-ray performed at this time. Careful injection of the capsule left fifth MPJ and deep debridable tissue accomplished at this time. May require orthotics or possible osteotomy reappoint one month to see how this has progressed

## 2013-05-30 ENCOUNTER — Ambulatory Visit: Payer: 59 | Admitting: Podiatry

## 2013-09-01 ENCOUNTER — Emergency Department (HOSPITAL_COMMUNITY)
Admission: EM | Admit: 2013-09-01 | Discharge: 2013-09-01 | Disposition: A | Discharge status: Home / Self Care | Payer: 59 | Attending: Emergency Medicine | Admitting: Emergency Medicine

## 2013-09-01 ENCOUNTER — Encounter (HOSPITAL_COMMUNITY): Payer: Self-pay | Admitting: Emergency Medicine

## 2013-09-01 ENCOUNTER — Emergency Department (HOSPITAL_COMMUNITY): Payer: 59

## 2013-09-01 DIAGNOSIS — S66819A Strain of other specified muscles, fascia and tendons at wrist and hand level, unspecified hand, initial encounter: Secondary | ICD-10-CM

## 2013-09-01 DIAGNOSIS — Y9389 Activity, other specified: Secondary | ICD-10-CM | POA: Insufficient documentation

## 2013-09-01 DIAGNOSIS — Y929 Unspecified place or not applicable: Secondary | ICD-10-CM | POA: Insufficient documentation

## 2013-09-01 DIAGNOSIS — W261XXA Contact with sword or dagger, initial encounter: Secondary | ICD-10-CM

## 2013-09-01 DIAGNOSIS — S61209A Unspecified open wound of unspecified finger without damage to nail, initial encounter: Secondary | ICD-10-CM | POA: Insufficient documentation

## 2013-09-01 DIAGNOSIS — J45909 Unspecified asthma, uncomplicated: Secondary | ICD-10-CM | POA: Insufficient documentation

## 2013-09-01 DIAGNOSIS — S61219A Laceration without foreign body of unspecified finger without damage to nail, initial encounter: Secondary | ICD-10-CM

## 2013-09-01 DIAGNOSIS — W260XXA Contact with knife, initial encounter: Secondary | ICD-10-CM | POA: Insufficient documentation

## 2013-09-01 MED ORDER — OXYCODONE-ACETAMINOPHEN 5-325 MG PO TABS
1.0000 | ORAL_TABLET | Freq: Once | ORAL | Status: AC
  Administered 2013-09-01: 1 via ORAL
  Filled 2013-09-01: qty 1

## 2013-09-01 MED ORDER — OXYCODONE-ACETAMINOPHEN 5-325 MG PO TABS
2.0000 | ORAL_TABLET | Freq: Once | ORAL | Status: AC
  Administered 2013-09-01: 2 via ORAL
  Filled 2013-09-01: qty 2

## 2013-09-01 MED ORDER — OXYCODONE-ACETAMINOPHEN 5-325 MG PO TABS: 1.0000 | ORAL_TABLET | Freq: Four times a day (QID) | ORAL | Status: DC | PRN

## 2013-09-01 MED ORDER — CEPHALEXIN 500 MG PO CAPS: ORAL_CAPSULE | ORAL | Status: DC

## 2013-09-01 MED ORDER — ONDANSETRON HCL 4 MG PO TABS: 4.0000 mg | ORAL_TABLET | Freq: Four times a day (QID) | ORAL | Status: DC

## 2013-09-01 NOTE — ED Notes (Signed)
Family at bedside. 

## 2013-09-01 NOTE — Progress Notes (Signed)
Orthopedic Tech Progress Note Patient Details:  Michael Hood 1990/10/29 [id number]  Ortho Devices Type of Ortho Device: Finger splint Ortho Device/Splint Location: RUE Ortho Device/Splint Interventions: Ordered;Application   Jennye Moccasin 09/01/2013, 6:36 PM

## 2013-09-01 NOTE — ED Notes (Signed)
Spoke with Ortho tech to apply finger splint.

## 2013-09-01 NOTE — ED Notes (Signed)
Pt states he was using a knife and it came back and sliced his right third finger. Bleeding controlled at this time. Redressed in triage

## 2013-09-01 NOTE — ED Provider Notes (Signed)
CSN: [id number]     Arrival date & time 09/01/13  1424 History  This chart was scribed for non-physician practitioner Junious SilkHannah Kamaile Zachow, PA working with Junius ArgyleForrest S Harrison, MD by Elveria Risingimelie Horne, ED Scribe. This patient was seen in room TR08C/TR08C and the patient's care was started at 4:13 PM.   Chief Complaint  Patient presents with  . Laceration    The history is provided by the patient. No language interpreter was used.   HPI Comments: Michael Hood is a 23 y.o. male who presents to the Emergency Department with laceration to his right third finger that occurred three hours ago. Patient reports cutting his right third finger with a folding knife. Patient reports decreased sensation at the tip of the finger. He is able to flex his finger, but not extend the tip of his finger. Patient is right hand dominant. Patient verifies updated Tetanus vaccination.   Past Medical History  Diagnosis Date  . Asthma     as a child  . 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 Percutaneous pinning metacarpal fracture  01/05/2012    left 5th  . Hardware removal  03/17/2012    Procedure: HARDWARE REMOVAL;  Surgeon: Nicki ReaperGary R Kuzma, MD;  Location: [facility];  Service: Orthopedics;  Laterality: Left;  Removal IM Rod Left hand    No family history on file. History  Substance Use Topics  . Smoking status: Never Smoker   . Smokeless tobacco: Never Used  . Alcohol Use: Yes     Comment: 2 x/week    Review of Systems  Constitutional: Negative for fever and chills.  Respiratory: Negative for shortness of breath.   Cardiovascular: Negative for chest pain.  Skin: Positive for wound.       Laceration  All other systems reviewed and are negative.     Allergies  Review of patient's allergies indicates no known allergies.  Home Medications   Prior to Admission medications   Not on File   Triage Vitals: BP 98/40  Pulse 72   Temp(Src) 97 F (36.1 C) (Oral)  Resp 22  SpO2 97% Physical Exam  Nursing note and vitals reviewed. Constitutional: He is oriented to person, place, and time. He appears well-developed and well-nourished. No distress.  HENT:  Head: Normocephalic and atraumatic.  Right Ear: External ear normal.  Left Ear: External ear normal.  Nose: Nose normal.  Eyes: Conjunctivae and EOM are normal.  Neck: Normal range of motion. Neck supple. No tracheal deviation present.  Cardiovascular: Normal rate, regular rhythm, normal heart sounds, intact distal pulses and normal pulses.   Pulses:      Radial pulses are 2+ on the right side, and 2+ on the left side.  Pulmonary/Chest: Effort normal and breath sounds normal. No stridor. No respiratory distress.  Abdominal: Soft. He exhibits no distension. There is no tenderness.  Musculoskeletal: Normal range of motion.  2 cm laceration to dorsal aspect of right third finger. Patient is unable to extend DIP. Finger rests in flexion. Decreased sensation to distal tip of finger. Sensation intact proximal to DIP.  Capillary refill is 3 seconds.   Neurological: He is alert and oriented to person, place, and time.  Skin: Skin is warm and dry. He is not diaphoretic.  Psychiatric: He has a normal mood and affect. His behavior is normal.    ED Course  Procedures (including critical care time) DIAGNOSTIC STUDIES: Oxygen  Saturation is 97% on room air, normal by my interpretation.    COORDINATION OF CARE: 4:16 PM- Will order X-ray. Discussed treatment plan with patient at bedside and patient agreed to plan.   LACERATION REPAIR Performed by: Mora Bellman Authorized by: Mora Bellman Consent: Verbal consent obtained. Risks and benefits: risks, benefits and alternatives were discussed Consent given by: patient Patient identity confirmed: provided demographic data Prepped and Draped in normal sterile fashion Wound explored  Laceration Location: right  third finger  Laceration Length: 2 cm  No Foreign Bodies seen or palpated  Anesthesia: digital block  Local anesthetic: lidocaine 2%  Anesthetic total: 3 ml  Irrigation method: syringe Amount of cleaning: standard  Skin closure: 5-0 Ethilon  Number of sutures: 3  Technique: simple interrupted   Patient tolerance: Patient tolerated the procedure well with no immediate complications.  5:53 PM- Discussed patient with Dr. Izora Ribas. Will loosely suture laceration, place in a finger splint give antibiotics, and have him call the office to be seen on Monday.     Labs Review Labs Reviewed - No data to display  Imaging Review Dg Finger Middle Right  09/01/2013   CLINICAL DATA:  Finger laceration, bleeding.  EXAM: RIGHT MIDDLE FINGER 2+V  COMPARISON:  08/07/2006  FINDINGS: There is a laceration over the DIP joint posteriorly. No underlying fracture, subluxation or dislocation. No radiopaque foreign bodies.  IMPRESSION: No acute bony abnormality or radiopaque foreign body.   Electronically Signed   By: Charlett Nose M.D.   On: 09/01/2013 17:05     EKG Interpretation None      MDM   Final diagnoses:  Finger laceration  Extensor tendon rupture of hand   Patient is a 23 year old male presenting to the emergency department with finger laceration. TDAP UTD. There appears to be damage to the extensor tendon near the DIP. Patient is unable to extend his finger. There is decreased sensation to the distal tip of his finger. He is right-hand dominant. The wound was loosely closed and the patient was discharged home with Keflex. His finger was placed in hyperextension and a finger splint. He was instructed to call Dr. Debby Bud office tomorrow to be seen on Monday. Discussed reasons to return to the emergency department immediately. Vital signs stable for discharge. Dr. Romeo Apple evaluated this patient and agrees with plan.  Patient / Family / Caregiver informed of clinical course, understand  medical decision-making process, and agree with plan.   I personally performed the services described in this documentation, which was scribed in my presence. The recorded information has been reviewed and is accurate.     Mora Bellman, PA-C 09/01/13 [phone]

## 2013-09-01 NOTE — Discharge Instructions (Signed)
Laceration Care, Adult A laceration is a cut that goes through all layers of the skin. The cut goes into the tissue beneath the skin. HOME CARE For stitches (sutures) or staples:  Keep the cut clean and dry.  If you have a bandage (dressing), change it at least once a day. Change the bandage if it gets wet or dirty, or as told by your doctor.  Wash the cut with soap and water 2 times a day. Rinse the cut with water. Pat it dry with a clean towel.  Put a thin layer of medicated cream on the cut as told by your doctor.  You may shower after the first 24 hours. Do not soak the cut in water until the stitches are removed.  Only take medicines as told by your doctor.  Have your stitches or staples removed as told by your doctor. For skin adhesive strips:  Keep the cut clean and dry.  Do not get the strips wet. You may take a bath, but be careful to keep the cut dry.  If the cut gets wet, pat it dry with a clean towel.  The strips will fall off on their own. Do not remove the strips that are still stuck to the cut. For wound glue:  You may shower or take baths. Do not soak or scrub the cut. Do not swim. Avoid heavy sweating until the glue falls off on its own. After a shower or bath, pat the cut dry with a clean towel.  Do not put medicine on your cut until the glue falls off.  If you have a bandage, do not put tape over the glue.  Avoid lots of sunlight or tanning lamps until the glue falls off. Put sunscreen on the cut for the first year to reduce your scar.  The glue will fall off on its own. Do not pick at the glue. You may need a tetanus shot if:  You cannot remember when you had your last tetanus shot.  You have never had a tetanus shot. If you need a tetanus shot and you choose not to have one, you may get tetanus. Sickness from tetanus can be serious. GET HELP RIGHT AWAY IF:   Your pain does not get better with medicine.  Your arm, hand, leg, or foot loses feeling  (numbness) or changes color.  Your cut is bleeding.  Your joint feels weak, or you cannot use your joint.  You have painful lumps on your body.  Your cut is red, puffy (swollen), or painful.  You have a red line on the skin near the cut.  You have yellowish-white fluid (pus) coming from the cut.  You have a fever.  You have a bad smell coming from the cut or bandage.  Your cut breaks open before or after stitches are removed.  You notice something coming out of the cut, such as wood or glass.  You cannot move a finger or toe. MAKE SURE YOU:   Understand these instructions.  Will watch your condition.  Will get help right away if you are not doing well or get worse. Document Released: 09/10/2007 Document Revised: 06/16/2011 Document Reviewed: 09/17/2010 [facility] Patient Information 2014 [place], [place].  Tendon Repair You have an injured tendon. This is a cord like structure that connects muscles to bones. Muscles and tendons work together to move your arms, legs, fingers, and toes. Your doctor may repair your tendon by sewing it back together. Following this repair the tendon  needs to be immobilized (made to not move) and not used to allow it to heal. Sometimes the condition of the wound and the type of damage done may make it necessary to simply clean and repair the wound and then go back in and repair the tendon in about one week to ten days to obtain a better result. Sometimes x-rays are required to make sure there is no additional boney injury. With complete rupture (break) of a tendon, surgical repair with casting is necessary. Surgery allows the surgeon to put the tendon back together. The cast is used to allow the repair time to heal. The injury may be put in a caste or immobilized for 6 to 10 weeks. Immobilization means that the tendon injured is kept in position with a cast or splint. Once your caregiver feels you have healed well enough following this injury, they will  provide exercises you can do to rehabilitate (make better) the injured tendon. HOME CARE INSTRUCTIONS   Do not use the injured tendon for as long as directed by your caregiver.  Rest the tendon as directed. Do not use it for lifting, walking, etc.  Leave the splint or dressing in place for as long as directed by your caregiver. Return for your first dressing change as directed.  Keep the dressing clean and dry.  Keep the injured area raised above the level of your heart as much as possible for the first week. SEEK MEDICAL CARE IF:   You have increased bleeding (more than a small spot) from the wound or from beneath your cast or splint.  You have redness, swelling, or increasing pain in the wound or from beneath your cast or splint.  You have pus coming from the wound or from beneath the cast or splint.  You notice a bad smell coming from the wound or dressing or from beneath your cast or splint.  You develop increasing pain and swelling, not controlled with medicine. SEEK IMMEDIATE MEDICAL CARE IF:   You have a fever.  You develop a rash.  You have difficulty breathing.  You have any allergic problems. Document Released: 09/17/2000 Document Revised: 06/16/2011 Document Reviewed: 02/08/2007 [facility] Patient Information 2014 [place], [place].

## 2013-09-02 NOTE — ED Provider Notes (Signed)
Medical screening examination/treatment/procedure(s) were conducted as a shared visit with non-physician practitioner(s) and myself.  I personally evaluated the patient during the encounter.   EKG Interpretation None      I interviewed and examined the patient. Lungs are CTAB. Cardiac exam wnl. Abdomen soft.  2cm lact to dorsal aspect of right long finger. Cant extend DIP, suspect ext tendon lac. Case discussed w/ hand by PA. Pt will have close f/u.   Junius Argyle, MD 09/02/13 1010

## 2013-09-07 ENCOUNTER — Encounter (HOSPITAL_COMMUNITY): Payer: Self-pay | Admitting: Emergency Medicine

## 2013-09-07 DIAGNOSIS — G8918 Other acute postprocedural pain: Secondary | ICD-10-CM | POA: Insufficient documentation

## 2013-09-07 DIAGNOSIS — Z9889 Other specified postprocedural states: Secondary | ICD-10-CM | POA: Insufficient documentation

## 2013-09-07 DIAGNOSIS — M25449 Effusion, unspecified hand: Secondary | ICD-10-CM | POA: Insufficient documentation

## 2013-09-07 DIAGNOSIS — J45909 Unspecified asthma, uncomplicated: Secondary | ICD-10-CM | POA: Insufficient documentation

## 2013-09-07 MED ORDER — OXYCODONE-ACETAMINOPHEN 5-325 MG PO TABS
2.0000 | ORAL_TABLET | Freq: Once | ORAL | Status: AC
  Administered 2013-09-07: 2 via ORAL
  Filled 2013-09-07: qty 2

## 2013-09-07 NOTE — ED Notes (Signed)
Patient had hand surgery Wednesday on his right middle finger.  Here tonight with severe pain to right forearm and hand

## 2013-09-08 ENCOUNTER — Emergency Department (HOSPITAL_COMMUNITY): Payer: 59

## 2013-09-08 ENCOUNTER — Emergency Department (HOSPITAL_COMMUNITY)
Admission: EM | Admit: 2013-09-08 | Discharge: 2013-09-08 | Disposition: A | Discharge status: Home / Self Care | Payer: 59 | Attending: Emergency Medicine | Admitting: Emergency Medicine

## 2013-09-08 DIAGNOSIS — G8918 Other acute postprocedural pain: Secondary | ICD-10-CM

## 2013-09-08 MED ORDER — MORPHINE SULFATE 4 MG/ML IJ SOLN
6.0000 mg | Freq: Once | INTRAMUSCULAR | Status: AC
  Administered 2013-09-08: 6 mg via INTRAMUSCULAR
  Filled 2013-09-08: qty 2

## 2013-09-08 MED ORDER — OXYCODONE-ACETAMINOPHEN 5-325 MG PO TABS: 1.0000 | ORAL_TABLET | Freq: Four times a day (QID) | ORAL | Status: DC | PRN

## 2013-09-08 NOTE — Discharge Instructions (Signed)
Pain Relief Preoperatively and Postoperatively °Being a good patient does not mean being a silent one. If you have questions, problems, or concerns about the pain you may feel after surgery, let your caregiver know. Patients have the right to assessment and management of pain. The treatment of pain after surgery is important to speed up recovery and return to normal activities. Severe pain after surgery, and the fear or anxiety associated with that pain, may cause extreme discomfort that: °· Prevents sleep. °· Decreases the ability to breathe deeply and cough. This can cause pneumonia or other upper airway infections. °· Causes your heart to beat faster and your blood pressure to be higher. °· Increases the risk for constipation and bloating. °· Decreases the ability of wounds to heal. °· May result in depression, increased anxiety, and feelings of helplessness. °Relief of pain before surgery is also important because it will lessen the pain after surgery. Patients who receive both pain relief before and after surgery experience greater pain relief than those who only receive pain relief after surgery. Let your caregiver know if you are having uncontrolled pain. This is very important. Pain after surgery is more difficult to manage if it is permitted to become severe, so prompt and adequate treatment of acute pain is necessary. °PAIN CONTROL METHODS °Your caregivers follow policies and procedures about the management of patient pain. These guidelines should be explained to you before surgery. Plans for pain control after surgery must be mutually decided upon and instituted with your full understanding and agreement. Do not be afraid to ask questions regarding the care you are receiving. There are many different ways your caregivers will attempt to control your pain, including the following methods. °As needed pain control °· You may be given pain medicine either through your intravenous (IV) tube, or as a pill or  liquid you can swallow. You will need to let your caregiver know when you are having pain. Then, your caregiver will give you the pain medicine ordered for you. °· Your pain medicine may make you constipated. If constipation occurs, drink more liquids if you can. Your caregiver may have you take a mild laxative. °IV patient-controlled analgesia pump (PCA pump) °· You can get your pain medicine through the IV tube which goes into your vein. You are able to control the amount of pain medicine that you get. The pain medicine flows in through an IV tube and is controlled by a pump. This pump gives you a set amount of pain medicine when you push the button hooked up to it. Nobody should push this button but you or someone specifically assigned by you to do so. It is set up to keep you from accidentally giving yourself too much pain medicine. You will be able to start using your pain pump in the recovery room after your surgery. This method can be helpful for most types of surgery. °· If you are still having too much pain, tell your caregiver. Also, tell your caregiver if you are feeling too sleepy or nauseous. °Continuous epidural pain control °· A thin, soft tube (catheter) is put into your back. Pain medicine flows through the catheter to lessen pain in the part of your body where the surgery is done. Continuous epidural pain control may work best for you if you are having surgery on your chest, abdomen, hip area, or legs. The epidural catheter is usually put into your back just before surgery. The catheter is left in until you can eat and take medicine by mouth. In most cases,   this may take 2 to 3 days. °· Giving pain medicine through the epidural catheter may help you heal faster because: °· Your bowel gets back to normal faster. °· You can get back to eating sooner. °· You can be up and walking sooner. °Medicine that numbs the area (local anesthetic) °· You may receive an injection of pain medicine near where the  pain is (local infiltration). °· You may receive an injection of pain medicine near the nerve that controls the sensation to a specific part of the body (peripheral nerve block). °· Medicine may be put in the spine to block pain (spinal block). °Opioids °· Moderate to moderately severe acute pain after surgery may respond to opioids. Opioids are narcotic pain medicine. Opioids are often combined with non-narcotic medicines to improve pain relief, diminish the risk of side effects, and reduce the chance of addiction. °· If you follow your caregiver's directions about taking opioids and you do not have a history of substance abuse, your risk of becoming addicted is exceptionally small. Opioids are given for short periods of time in careful doses to prevent addiction. °Other methods of pain control include: °· Steroids. °· Physical therapy. °· Heat and cold therapy. °· Compression, such as wrapping an elastic bandage around the area of pain. °· Massage. °These various ways of controlling pain may be used together. Combining different methods of pain control is called multimodal analgesia. Using this approach has many benefits, including being able to eat, move around, and leave the hospital sooner. °Document Released: 06/14/2002 Document Revised: 06/16/2011 Document Reviewed: 06/18/2010 °Exit[employer]® Patient Information ©2014 [employer]. ° °

## 2013-09-08 NOTE — ED Notes (Signed)
pts girlfriend cussing and refusing wheelchair. States that pt can walk.

## 2013-09-08 NOTE — ED Provider Notes (Signed)
CSN: [id number]     Arrival date & time 09/07/13  2335 History   First MD Initiated Contact with Patient 09/08/13 0003     Chief Complaint  Patient presents with  . Post op pain    HPI Comments: [facility] with 1 hour of severe onset of right middle finger pain after tendon repair surgery today at 12:00 pm.  Patient states that he underwent a tendon repair with little complication this afternoon.  His pain was well controlled with a nerve block which wore off an hour before the patient came to the ED.  Patient tried taking 100 mg tramadol.  Patient states that he is pain was excruciating and could not be tolerated.  The patient states that he was not sent home with any medication after his surgery.  He has not contacted Dr. Izora Ribasoley his hand surgeon tonight since his surgery.     Past Medical History  Diagnosis Date  . Asthma     as a child  . 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 Percutaneous pinning metacarpal fracture  01/05/2012    left 5th  . Hardware removal  03/17/2012    Procedure: HARDWARE REMOVAL;  Surgeon: Nicki ReaperGary R Kuzma, MD;  Location: [facility];  Service: Orthopedics;  Laterality: Left;  Removal IM Rod Left hand    History reviewed. No pertinent family history. History  Substance Use Topics  . Smoking status: Never Smoker   . Smokeless tobacco: Never Used  . Alcohol Use: Yes     Comment: 2 x/week    Review of Systems  Constitutional: Negative for fever, chills and fatigue.  Gastrointestinal: Negative for nausea and vomiting.  Musculoskeletal: Positive for arthralgias and joint swelling.  Skin: Negative for color change.  All other systems reviewed and are negative.     Allergies  Review of patient's allergies indicates no known allergies.  Home Medications   Prior to Admission medications   Medication Sig Start Date End Date Taking? Authorizing Provider   cephALEXin (KEFLEX) 500 MG capsule 2 caps po bid x 7 days 09/01/13   Mora BellmanHannah S Merrell, PA-C  ondansetron (ZOFRAN) 4 MG tablet Take 1 tablet (4 mg total) by mouth every 6 (six) hours. 09/01/13   Mora BellmanHannah S Merrell, PA-C  oxyCODONE-acetaminophen (PERCOCET/ROXICET) 5-325 MG per tablet Take 1-2 tablets by mouth every 6 (six) hours as needed for moderate pain or severe pain. 09/01/13   Mora BellmanHannah S Merrell, PA-C   BP 128/101  Pulse 94  Temp(Src) 97.8 F (36.6 C) (Oral)  Resp 20  Ht 5\' 8"  (1.727 m)  Wt 130 lb (58.968 kg)  BMI 19.77 kg/m2  SpO2 99% Physical Exam  Nursing note and vitals reviewed. Constitutional: He is oriented to person, place, and time. He appears well-developed and well-nourished. He appears distressed.  Writhing in pain on the bed.  HENT:  Head: Normocephalic and atraumatic.  Eyes: Conjunctivae are normal. No scleral icterus.  Neck: Normal range of motion. Neck supple.  Cardiovascular: Normal rate, regular rhythm, normal heart sounds and intact distal pulses.  Exam reveals no gallop and no friction rub.   No murmur heard. Pulmonary/Chest: Effort normal and breath sounds normal. No respiratory distress. He has no wheezes. He has no rales. He exhibits no tenderness.  Musculoskeletal:  Clean transverse surgical incision located over the right third DIP.  No signs of erythema, discharge.  He has  tenderness to palpation over the distal phalanx.  ROM was not tested due to recent tendon repair.    Neurological: He is alert and oriented to person, place, and time.  Skin: Skin is warm and dry. He is not diaphoretic.  Psychiatric: He has a normal mood and affect. His behavior is normal. Judgment and thought content normal.    ED Course  Procedures (including critical care time) Labs Review Labs Reviewed - No data to display  Imaging Review Dg Finger Middle Right  09/08/2013   CLINICAL DATA:  Right middle finger ligament repair today. Increasing pain tonight.  EXAM: RIGHT MIDDLE  FINGER 2+V  COMPARISON:  None.  FINDINGS: No fracture. The joints are normally space and aligned. No radiopaque foreign body. There is soft tissue irregularity over the dorsal margin of the distal finger, dorsal to the base of the distal phalanx, presumably the surgical incision site.  IMPRESSION: No fracture or acute finding.  No radiopaque foreign body.   Electronically Signed   By: Amie Portland M.D.   On: 09/08/2013 01:38     EKG Interpretation None      MDM   Final diagnoses:  Post-operative pain   Patient presents with uncontrollable post operative pain after a tendon repair this afternoon as performed by Dr. Izora Ribas.  Patient did have long lasting relief with oxycodone and was writhing on the bed.  We have given 6 mg of morphine given IM with good relief.  Xray of the finger showed no acute abnormality.  We have paged Dr. Izora Ribas who stated that the patient can be discharged home at this time with 10 tablets of oxycodone.  He would like the patient to be followed up in the office.  Patient and his wife state understanding of this at this time.       Clydie Braun, PA-C 09/08/13 [phone]

## 2013-09-08 NOTE — ED Provider Notes (Signed)
Pt seen by PA Forrucci:  Michael Hood is a 23 y.o. male complaining of severe pain to right middle digit status post extensor tendon repair to GI PE by Dr. Izora Ribas this afternoon at noon. Patient states that the digital block has worn off, he took 3 tramadol with no relief. Pain is severe, 10 out of 10.  Patient in distress, jerking and moving erratically. Incision to dorsum of right third PIP clean dry and intact, no discharge or swelling. Distal sensation is grossly intact. Patient given 2 Percocet with mild relief of pain.  X-ray with no acute abnormality. 2x  5 mg Percocet has kept the patient comfortable for 2 hours, the pain is severe rated 9/10.  In consult from Dr. Izora Ribas appreciated: He will followup the patient in the office tomorrow, recommends 10x 5mg  Percocet.                                                                                                        [name], PA-C 09/08/13 [phone]

## 2013-09-08 NOTE — ED Notes (Signed)
Pt had surg today.  St's tonight finger started hurting and has continued to be painful.  No swelling noted to hand

## 2013-09-11 NOTE — ED Provider Notes (Signed)
Medical screening examination/treatment/procedure(s) were performed by non-physician practitioner and as supervising physician I was immediately available for consultation/collaboration.    Vida Roller, MD 09/11/13 1053

## 2013-09-11 NOTE — ED Provider Notes (Signed)
Medical screening examination/treatment/procedure(s) were performed by non-physician practitioner and as supervising physician I was immediately available for consultation/collaboration.    Ansen Sayegh D Staphanie Harbison, MD 09/11/13 1053 

## 2013-11-18 ENCOUNTER — Emergency Department (INDEPENDENT_AMBULATORY_CARE_PROVIDER_SITE_OTHER)
Admission: EM | Admit: 2013-11-18 | Discharge: 2013-11-18 | Disposition: A | Discharge status: Home / Self Care | Payer: 59 | Source: Home / Self Care | Attending: Emergency Medicine | Admitting: Emergency Medicine

## 2013-11-18 ENCOUNTER — Emergency Department (INDEPENDENT_AMBULATORY_CARE_PROVIDER_SITE_OTHER): Payer: 59

## 2013-11-18 ENCOUNTER — Encounter (HOSPITAL_COMMUNITY): Payer: Self-pay | Admitting: Emergency Medicine

## 2013-11-18 DIAGNOSIS — S92919A Unspecified fracture of unspecified toe(s), initial encounter for closed fracture: Secondary | ICD-10-CM

## 2013-11-18 DIAGNOSIS — S92402A Displaced unspecified fracture of left great toe, initial encounter for closed fracture: Secondary | ICD-10-CM

## 2013-11-18 MED ORDER — HYDROCODONE-ACETAMINOPHEN 5-325 MG PO TABS: 1.0000 | ORAL_TABLET | Freq: Four times a day (QID) | ORAL | Status: DC | PRN

## 2013-11-18 NOTE — Discharge Instructions (Signed)
You have a fracture of your big toe bone.  It extends into the joint in your big toe. Keep the splint on until you see your orthopedic doctor, Dr. Merlyn LotKuzma. Do NOT bear weight on the foot.  We do not want the fracture to become out of place.  Take tylenol and ibuprofen for pain. Apply ice 2-3 times a day. Use Norco 1-2 tablets every 6 hours as needed for severe pain.  Call Dr. Merrilee SeashoreKuzma's office today for an appointment in about 1 week.

## 2013-11-18 NOTE — ED Notes (Signed)
C/o toe injury due to dropping 15 pound weight on big toe on left foot yesterday Ice pack and tylenol was used as tx States toe movement was little

## 2013-11-18 NOTE — ED Provider Notes (Signed)
CSN: [id number]     Arrival date & time 11/18/13  1019 History   First MD Initiated Contact with Patient 11/18/13 1030     Chief Complaint  Patient presents with  . Toe Injury   (Consider location/radiation/quality/duration/timing/severity/associated sxs/prior Treatment) HPI He is a 23 year old male here today for evaluation of left great toe injury. He states that yesterday afternoon he dropped a 15 pound weight onto the top of the great toe. He had immediate pain and swelling. Also had some bruising. He applied ice yesterday afternoon. And he has taken some tramadol for pain relief. Pain is worse with movement and palpation.  Past Medical History  Diagnosis Date  . Asthma     as a child  . 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 Percutaneous pinning metacarpal fracture  01/05/2012    left 5th  . Hardware removal  03/17/2012    Procedure: HARDWARE REMOVAL;  Surgeon: Nicki ReaperGary R Kuzma, MD;  Location: [facility];  Service: Orthopedics;  Laterality: Left;  Removal IM Rod Left hand    History reviewed. No pertinent family history. History  Substance Use Topics  . Smoking status: Never Smoker   . Smokeless tobacco: Never Used  . Alcohol Use: Yes     Comment: 2 x/week    Review of Systems  Musculoskeletal:       Left great toe injury    Allergies  Review of patient's allergies indicates no known allergies.  Home Medications   Prior to Admission medications   Medication Sig Start Date End Date Taking? Authorizing Provider  cephALEXin (KEFLEX) 500 MG capsule 2 caps po bid x 7 days 09/01/13   Mora BellmanHannah S Merrell, PA-C  HYDROcodone-acetaminophen (NORCO) 5-325 MG per tablet Take 1-2 tablets by mouth every 6 (six) hours as needed for moderate pain. 11/18/13   Charm RingsErin J Elven Laboy, MD  ondansetron (ZOFRAN) 4 MG tablet Take 1 tablet (4 mg total) by mouth every 6 (six) hours. 09/01/13   Mora BellmanHannah S Merrell, PA-C   oxyCODONE-acetaminophen (PERCOCET/ROXICET) 5-325 MG per tablet Take 1-2 tablets by mouth every 6 (six) hours as needed for moderate pain or severe pain. 09/01/13   Mora BellmanHannah S Merrell, PA-C  oxyCODONE-acetaminophen (PERCOCET/ROXICET) 5-325 MG per tablet Take 1 tablet by mouth every 6 (six) hours as needed for moderate pain or severe pain (severe pain). 09/08/13   Courtney A Forcucci, PA-C   BP 126/83  Pulse 76  Temp(Src) 98.2 F (36.8 C) (Oral)  Resp 18  SpO2 99% Physical Exam  Constitutional: He appears well-developed and well-nourished. No distress.  Musculoskeletal:  Left great toe: bruising and swelling over DIP joint.  Able to move the toe.  Pain with palpation over DIP joint.  Brisk cap refill.    ED Course  Procedures (including critical care time) Labs Review Labs Reviewed - No data to display  Imaging Review Dg Toe Great Left  11/18/2013   CLINICAL DATA:  Dropped 15 lb weight on great toe when working out yesterday, now with pain, swelling and bruising LEFT great toe  EXAM: LEFT GREAT TOE  COMPARISON:  LEFT foot radiographs 05/02/2013  FINDINGS: Soft tissue swelling great toe.  Osseous mineralization normal.  Joint spaces preserved.  Nondisplaced longitudinal fracture through distal phalanx LEFT great toe extending intra-articular at IP joint.  No additional fracture or dislocation identified.  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  IMPRESSION: Longitudinal  nondisplaced fracture distal phalanx LEFT great toe extending intra-articular at IP joint.   Electronically Signed   By: Ulyses Southward M.D.   On: 11/18/2013 11:38     MDM   1. Fracture of left great toe, closed, initial encounter    X-ray shows fracture of the distal great toe phalanx that extends into the IP joint. Will place posterior leg splint and keep him nonweightbearing. Crutches provided as well. Discussed fracture and importance of nonweightbearing with the patient to preserve balance and  normal walking in the future. Recommended alternating Tylenol and ibuprofen for pain. Recommended icing 2-3 times a day. Prescription for Norco 5/325 mg, #20 provided for severe pain. Followup with orthopedic doctor, Dr. Merlyn Lot, in one week.    Charm Rings, MD 11/18/13 [phone]

## 2013-11-18 NOTE — Progress Notes (Signed)
Orthopedic Tech Progress Note Patient Details:  Michael Hood [birth year] [id number]  Ortho Devices Type of Ortho Device: Ace wrap;Post (short leg) splint Ortho Device/Splint Location: LLE Ortho Device/Splint Interventions: Ordered;Application   Jennye MoccasinHughes, [name] 11/18/2013, 12:18 PM

## 2014-01-17 IMAGING — CR DG HAND COMPLETE 3+V*L*
3 series · 3 of 3 positions shown · non-contrast
Comparison: None.

CLINICAL DATA: Punching injury

LEFT HAND - COMPLETE 3+ VIEW

[view not recorded (1 of 3)]
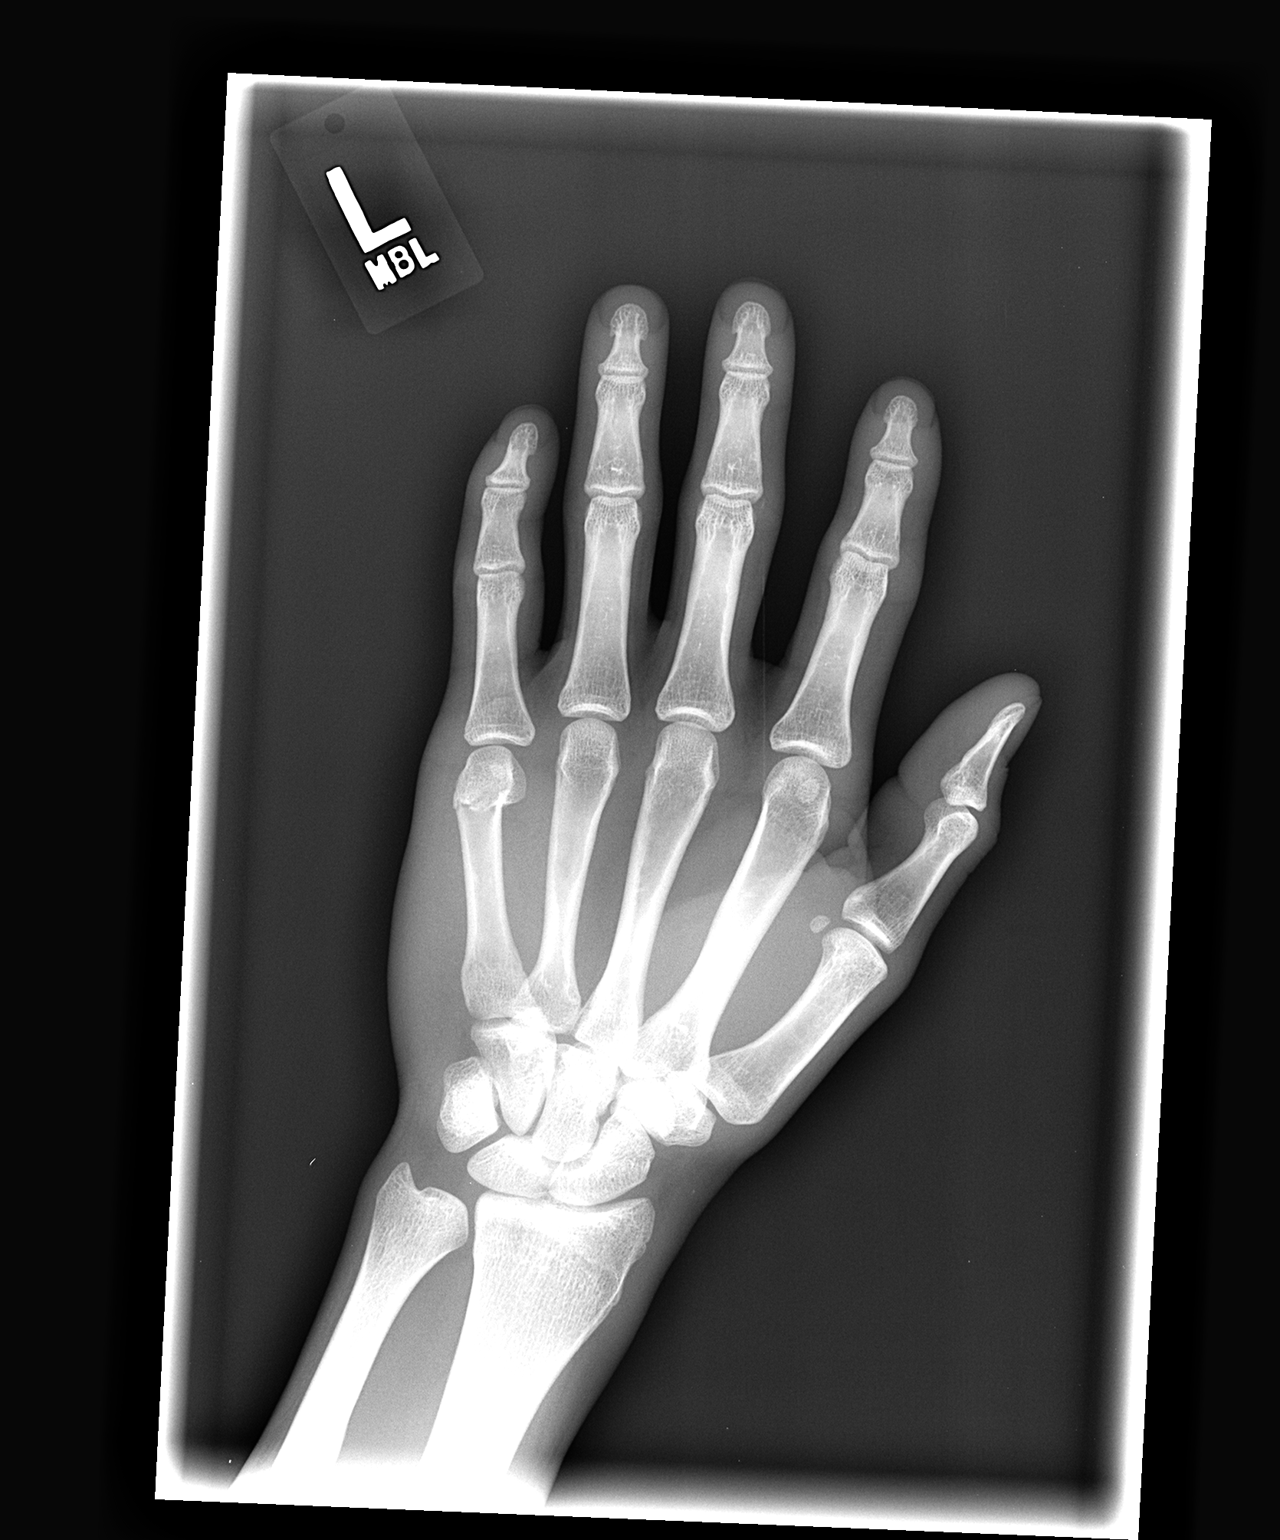

[view not recorded (2 of 3)]
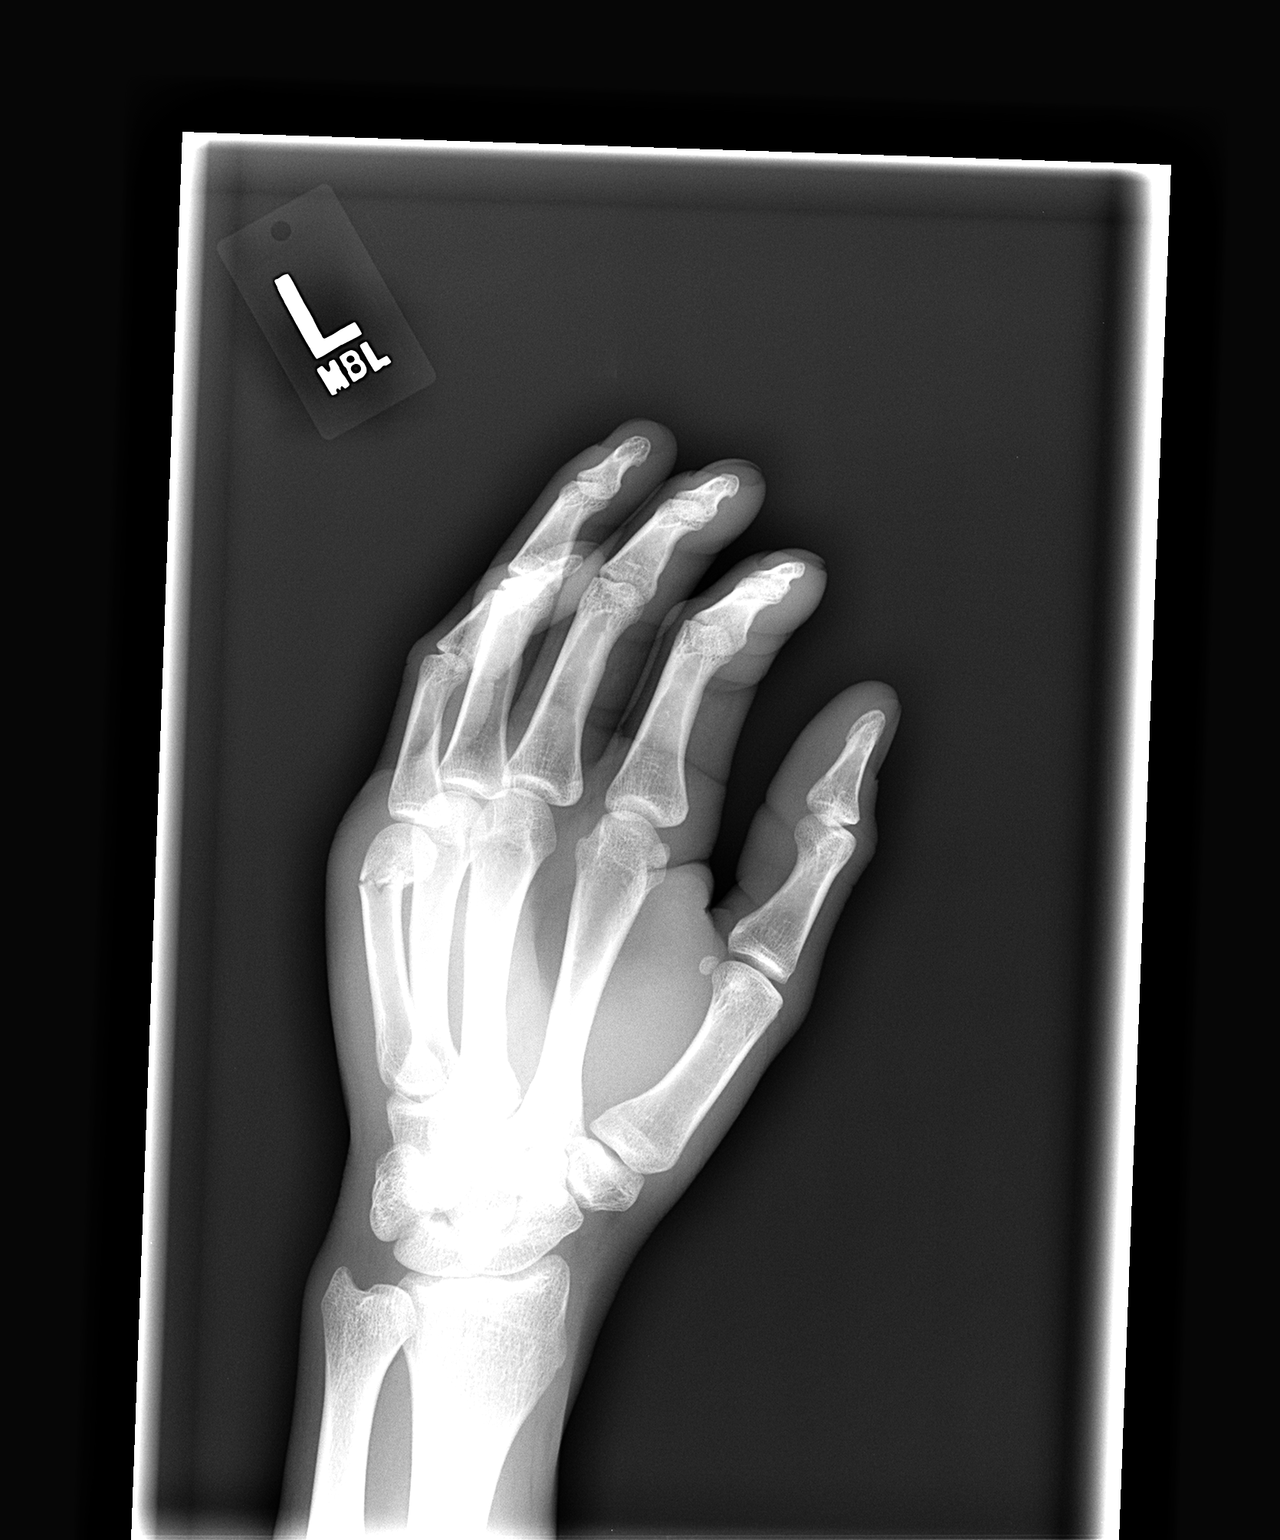

[view not recorded (3 of 3)]
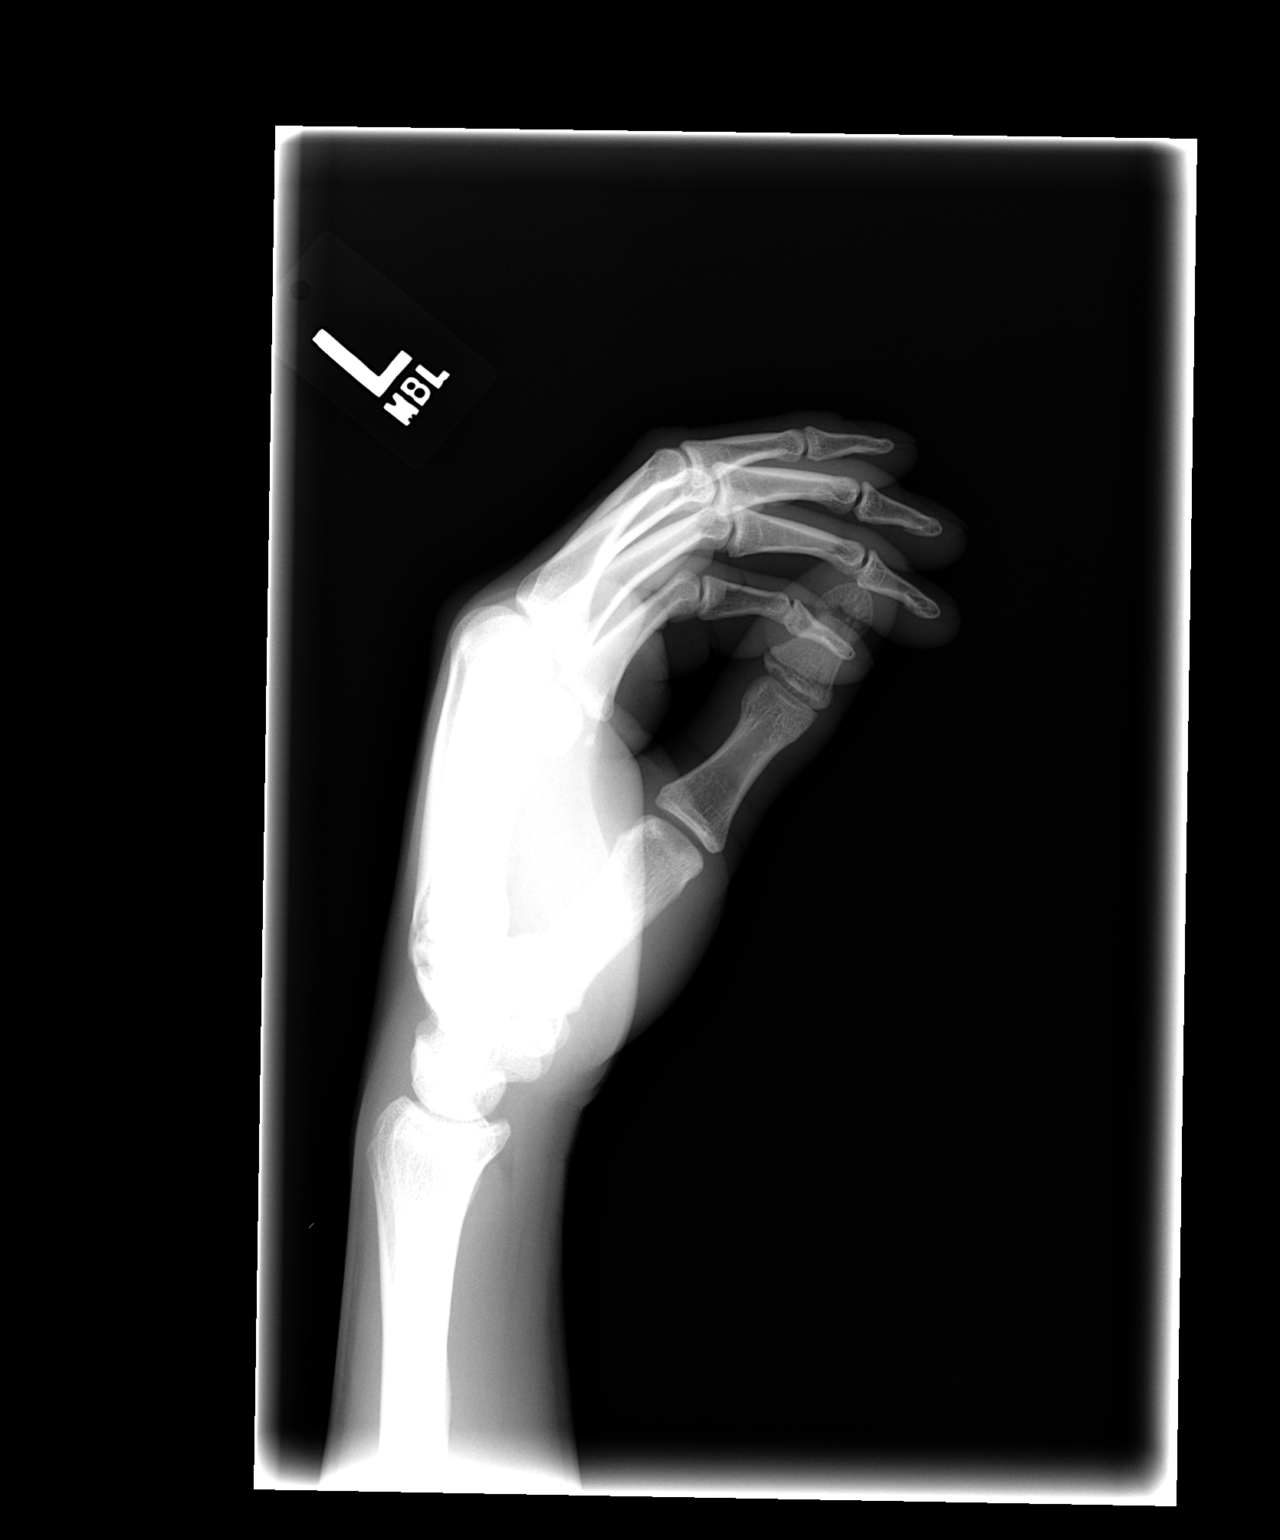

[3 of 3 positions shown; findings below may reference images not displayed]

FINDINGS: Three views of the left hand submitted.  There is mild
displaced angulated fracture distal aspect left fifth metacarpal.
There is adjacent soft tissue swelling.
IMPRESSION: Mild displaced fracture distal aspect left fifth metacarpal.

## 2015-02-22 ENCOUNTER — Encounter (HOSPITAL_COMMUNITY): Payer: Self-pay | Admitting: Cardiology

## 2015-02-22 ENCOUNTER — Emergency Department (HOSPITAL_COMMUNITY)
Admission: EM | Admit: 2015-02-22 | Discharge: 2015-02-22 | Disposition: A | Discharge status: Home / Self Care | Payer: 59 | Attending: Emergency Medicine | Admitting: Emergency Medicine

## 2015-02-22 DIAGNOSIS — Y92410 Unspecified street and highway as the place of occurrence of the external cause: Secondary | ICD-10-CM | POA: Insufficient documentation

## 2015-02-22 DIAGNOSIS — J45909 Unspecified asthma, uncomplicated: Secondary | ICD-10-CM | POA: Diagnosis not present

## 2015-02-22 DIAGNOSIS — Z969 Presence of functional implant, unspecified: Secondary | ICD-10-CM | POA: Diagnosis not present

## 2015-02-22 DIAGNOSIS — Y9389 Activity, other specified: Secondary | ICD-10-CM | POA: Diagnosis not present

## 2015-02-22 DIAGNOSIS — Y998 Other external cause status: Secondary | ICD-10-CM | POA: Diagnosis not present

## 2015-02-22 DIAGNOSIS — M542 Cervicalgia: Secondary | ICD-10-CM

## 2015-02-22 DIAGNOSIS — S199XXA Unspecified injury of neck, initial encounter: Secondary | ICD-10-CM | POA: Diagnosis not present

## 2015-02-22 DIAGNOSIS — S4992XA Unspecified injury of left shoulder and upper arm, initial encounter: Secondary | ICD-10-CM | POA: Insufficient documentation

## 2015-02-22 MED ORDER — METHOCARBAMOL 500 MG PO TABS: 500.0000 mg | ORAL_TABLET | Freq: Two times a day (BID) | ORAL | Status: DC

## 2015-02-22 MED ORDER — NAPROXEN 500 MG PO TABS: 500.0000 mg | ORAL_TABLET | Freq: Two times a day (BID) | ORAL | Status: DC

## 2015-02-22 NOTE — Discharge Instructions (Signed)
Scheduled follow-up appointment with community health [facility] wellness or one of the primary care clinics and the resource guide. Avoid strenuous activity until your pain begins to resolve. Robaxin as a muscle relaxant may make you drowsy. Be cautious when using and avoid driving after taking.   Cervical Strain and Sprain With Rehab Cervical strain and sprain are injuries that commonly occur with "whiplash" injuries. Whiplash occurs when the neck is forcefully whipped backward or forward, such as during a motor vehicle accident or during contact sports. The muscles, ligaments, tendons, discs, and nerves of the neck are susceptible to injury when this occurs. RISK FACTORS Risk of having a whiplash injury increases if:  Osteoarthritis of the spine.  Situations that make head or neck accidents or trauma more likely.  High-risk sports (football, rugby, wrestling, hockey, auto racing, gymnastics, diving, contact karate, or boxing).  Poor strength and flexibility of the neck.  Previous neck injury.  Poor tackling technique.  Improperly fitted or padded equipment. SYMPTOMS   Pain or stiffness in the front or back of neck or both.  Symptoms may present immediately or up to 24 hours after injury.  Dizziness, headache, nausea, and vomiting.  Muscle spasm with soreness and stiffness in the neck.  Tenderness and swelling at the injury site. PREVENTION  Learn and use proper technique (avoid tackling with the head, spearing, and head-butting; use proper falling techniques to avoid landing on the head).  Warm up and stretch properly before activity.  Maintain physical fitness:  Strength, flexibility, and endurance.  Cardiovascular fitness.  Wear properly fitted and padded protective equipment, such as padded soft collars, for participation in contact sports. PROGNOSIS  Recovery from cervical strain and sprain injuries is dependent on the extent of the injury. These injuries are usually  curable in 1 week to 3 months with appropriate treatment.  RELATED COMPLICATIONS   Temporary numbness and weakness may occur if the nerve roots are damaged, and this may persist until the nerve has completely healed.  Chronic pain due to frequent recurrence of symptoms.  Prolonged healing, especially if activity is resumed too soon (before complete recovery). TREATMENT  Treatment initially involves the use of ice and medication to help reduce pain and inflammation. It is also important to perform strengthening and stretching exercises and modify activities that worsen symptoms so the injury does not get worse. These exercises may be performed at home or with a therapist. For patients who experience severe symptoms, a soft, padded collar may be recommended to be worn around the neck.  Improving your posture may help reduce symptoms. Posture improvement includes pulling your chin and abdomen in while sitting or standing. If you are sitting, sit in a firm chair with your buttocks against the back of the chair. While sleeping, try replacing your pillow with a small towel rolled to 2 inches in diameter, or use a cervical pillow or soft cervical collar. Poor sleeping positions delay healing.  For patients with nerve root damage, which causes numbness or weakness, the use of a cervical traction apparatus may be recommended. Surgery is rarely necessary for these injuries. However, cervical strain and sprains that are present at birth (congenital) may require surgery. MEDICATION   If pain medication is necessary, nonsteroidal anti-inflammatory medications, such as aspirin and ibuprofen, or other minor pain relievers, such as acetaminophen, are often recommended.  Do not take pain medication for 7 days before surgery.  Prescription pain relievers may be given if deemed necessary by your caregiver. Use only as  directed and only as much as you need. HEAT AND COLD:   Cold treatment (icing) relieves pain and  reduces inflammation. Cold treatment should be applied for 10 to 15 minutes every 2 to 3 hours for inflammation and pain and immediately after any activity that aggravates your symptoms. Use ice packs or an ice massage.  Heat treatment may be used prior to performing the stretching and strengthening activities prescribed by your caregiver, physical therapist, or athletic trainer. Use a heat pack or a warm soak. SEEK MEDICAL CARE IF:   Symptoms get worse or do not improve in 2 weeks despite treatment.  New, unexplained symptoms develop (drugs used in treatment may produce side effects). EXERCISES RANGE OF MOTION (ROM) AND STRETCHING EXERCISES - Cervical Strain and Sprain These exercises may help you when beginning to rehabilitate your injury. In order to successfully resolve your symptoms, you must improve your posture. These exercises are designed to help reduce the forward-head and rounded-shoulder posture which contributes to this condition. Your symptoms may resolve with or without further involvement from your physician, physical therapist or athletic trainer. While completing these exercises, remember:   Restoring tissue flexibility helps normal motion to return to the joints. This allows healthier, less painful movement and activity.  An effective stretch should be held for at least 20 seconds, although you may need to begin with shorter hold times for comfort.  A stretch should never be painful. You should only feel a gentle lengthening or release in the stretched tissue. STRETCH- Axial Extensors  Lie on your back on the floor. You may bend your knees for comfort. Place a rolled-up hand towel or dish towel, about 2 inches in diameter, under the part of your head that makes contact with the floor.  Gently tuck your chin, as if trying to make a "double chin," until you feel a gentle stretch at the base of your head.  Hold __________ seconds. Repeat __________ times. Complete this  exercise __________ times per day.  STRETCH - Axial Extension   Stand or sit on a firm surface. Assume a good posture: chest up, shoulders drawn back, abdominal muscles slightly tense, knees unlocked (if standing) and feet hip width apart.  Slowly retract your chin so your head slides back and your chin slightly lowers. Continue to look straight ahead.  You should feel a gentle stretch in the back of your head. Be certain not to feel an aggressive stretch since this can cause headaches later.  Hold for __________ seconds. Repeat __________ times. Complete this exercise __________ times per day. STRETCH - Cervical Side Bend   Stand or sit on a firm surface. Assume a good posture: chest up, shoulders drawn back, abdominal muscles slightly tense, knees unlocked (if standing) and feet hip width apart.  Without letting your nose or shoulders move, slowly tip your right / left ear to your shoulder until your feel a gentle stretch in the muscles on the opposite side of your neck.  Hold __________ seconds. Repeat __________ times. Complete this exercise __________ times per day. STRETCH - Cervical Rotators   Stand or sit on a firm surface. Assume a good posture: chest up, shoulders drawn back, abdominal muscles slightly tense, knees unlocked (if standing) and feet hip width apart.  Keeping your eyes level with the ground, slowly turn your head until you feel a gentle stretch along the back and opposite side of your neck.  Hold __________ seconds. Repeat __________ times. Complete this exercise __________ times per day.  RANGE OF MOTION - Neck Circles   Stand or sit on a firm surface. Assume a good posture: chest up, shoulders drawn back, abdominal muscles slightly tense, knees unlocked (if standing) and feet hip width apart.  Gently roll your head down and around from the back of one shoulder to the back of the other. The motion should never be forced or painful.  Repeat the motion 10-20  times, or until you feel the neck muscles relax and loosen. Repeat __________ times. Complete the exercise __________ times per day. STRENGTHENING EXERCISES - Cervical Strain and Sprain These exercises may help you when beginning to rehabilitate your injury. They may resolve your symptoms with or without further involvement from your physician, physical therapist, or athletic trainer. While completing these exercises, remember:   Muscles can gain both the endurance and the strength needed for everyday activities through controlled exercises.  Complete these exercises as instructed by your physician, physical therapist, or athletic trainer. Progress the resistance and repetitions only as guided.  You may experience muscle soreness or fatigue, but the pain or discomfort you are trying to eliminate should never worsen during these exercises. If this pain does worsen, stop and make certain you are following the directions exactly. If the pain is still present after adjustments, discontinue the exercise until you can discuss the trouble with your clinician. STRENGTH - Cervical Flexors, Isometric  Face a wall, standing about 6 inches away. Place a small pillow, a ball about 6-8 inches in diameter, or a folded towel between your forehead and the wall.  Slightly tuck your chin and gently push your forehead into the soft object. Push only with mild to moderate intensity, building up tension gradually. Keep your jaw and forehead relaxed.  Hold 10 to 20 seconds. Keep your breathing relaxed.  Release the tension slowly. Relax your neck muscles completely before you start the next repetition. Repeat __________ times. Complete this exercise __________ times per day. STRENGTH- Cervical Lateral Flexors, Isometric   Stand about 6 inches away from a wall. Place a small pillow, a ball about 6-8 inches in diameter, or a folded towel between the side of your head and the wall.  Slightly tuck your chin and gently  tilt your head into the soft object. Push only with mild to moderate intensity, building up tension gradually. Keep your jaw and forehead relaxed.  Hold 10 to 20 seconds. Keep your breathing relaxed.  Release the tension slowly. Relax your neck muscles completely before you start the next repetition. Repeat __________ times. Complete this exercise __________ times per day. STRENGTH - Cervical Extensors, Isometric   Stand about 6 inches away from a wall. Place a small pillow, a ball about 6-8 inches in diameter, or a folded towel between the back of your head and the wall.  Slightly tuck your chin and gently tilt your head back into the soft object. Push only with mild to moderate intensity, building up tension gradually. Keep your jaw and forehead relaxed.  Hold 10 to 20 seconds. Keep your breathing relaxed.  Release the tension slowly. Relax your neck muscles completely before you start the next repetition. Repeat __________ times. Complete this exercise __________ times per day. POSTURE AND BODY MECHANICS CONSIDERATIONS - Cervical Strain and Sprain Keeping correct posture when sitting, standing or completing your activities will reduce the stress put on different body tissues, allowing injured tissues a chance to heal and limiting painful experiences. The following are general guidelines for improved posture. Your physician or physical  therapist will provide you with any instructions specific to your needs. While reading these guidelines, remember:  The exercises prescribed by your provider will help you have the flexibility and strength to maintain correct postures.  The correct posture provides the optimal environment for your joints to work. All of your joints have less wear and tear when properly supported by a spine with good posture. This means you will experience a healthier, less painful body.  Correct posture must be practiced with all of your activities, especially prolonged  sitting and standing. Correct posture is as important when doing repetitive low-stress activities (typing) as it is when doing a single heavy-load activity (lifting). PROLONGED STANDING WHILE SLIGHTLY LEANING FORWARD When completing a task that requires you to lean forward while standing in one place for a long time, place either foot up on a stationary 2- to 4-inch high object to help maintain the best posture. When both feet are on the ground, the low back tends to lose its slight inward curve. If this curve flattens (or becomes too large), then the back and your other joints will experience too much stress, fatigue more quickly, and can cause pain.  RESTING POSITIONS Consider which positions are most painful for you when choosing a resting position. If you have pain with flexion-based activities (sitting, bending, stooping, squatting), choose a position that allows you to rest in a less flexed posture. You would want to avoid curling into a fetal position on your side. If your pain worsens with extension-based activities (prolonged standing, working overhead), avoid resting in an extended position such as sleeping on your stomach. Most people will find more comfort when they rest with their spine in a more neutral position, neither too rounded nor too arched. Lying on a non-sagging bed on your side with a pillow between your knees, or on your back with a pillow under your knees will often provide some relief. Keep in mind, being in any one position for a prolonged period of time, no matter how correct your posture, can still lead to stiffness. WALKING Walk with an upright posture. Your ears, shoulders, and hips should all line up. OFFICE WORK When working at a desk, create an environment that supports good, upright posture. Without extra support, muscles fatigue and lead to excessive strain on joints and other tissues. CHAIR:  A chair should be able to slide under your desk when your back makes contact  with the back of the chair. This allows you to work closely.  The chair's height should allow your eyes to be level with the upper part of your monitor and your hands to be slightly lower than your elbows.  Body position:  Your feet should make contact with the floor. If this is not possible, use a foot rest.  Keep your ears over your shoulders. This will reduce stress on your neck and low back.   This information is not intended to replace advice given to you by your health care provider. Make sure you discuss any questions you have with your health care provider.   Document Released: 03/24/2005 Document Revised: 04/14/2014 Document Reviewed: 07/06/2008 Elsevier Interactive Patient Education 2016 [employer]  Tourist information centre managerMotor Vehicle Collision It is common to have multiple bruises and sore muscles after a motor vehicle collision (MVC). These tend to feel worse for the first 24 hours. You may have the most stiffness and soreness over the first several hours. You may also feel worse when you wake up the first morning after  your collision. After this point, you will usually begin to improve with each day. The speed of improvement often depends on the severity of the collision, the number of injuries, and the location and nature of these injuries. HOME CARE INSTRUCTIONS  Put ice on the injured area.  Put ice in a plastic bag.  Place a towel between your skin and the bag.  Leave the ice on for 15-20 minutes, 3-4 times a day, or as directed by your health care provider.  Drink enough fluids to keep your urine clear or pale yellow. Do not drink alcohol.  Take a warm shower or bath once or twice a day. This will increase blood flow to sore muscles.  You may return to activities as directed by your caregiver. Be careful when lifting, as this may aggravate neck or back pain.  Only take over-the-counter or prescription medicines for pain, discomfort, or fever as directed by your caregiver. Do not use  aspirin. This may increase bruising and bleeding. SEEK IMMEDIATE MEDICAL CARE IF:  You have numbness, tingling, or weakness in the arms or legs.  You develop severe headaches not relieved with medicine.  You have severe neck pain, especially tenderness in the middle of the back of your neck.  You have changes in bowel or bladder control.  There is increasing pain in any area of the body.  You have shortness of breath, light-headedness, dizziness, or fainting.  You have chest pain.  You feel sick to your stomach (nauseous), throw up (vomit), or sweat.  You have increasing abdominal discomfort.  There is blood in your urine, stool, or vomit.  You have pain in your shoulder (shoulder strap areas).  You feel your symptoms are getting worse. MAKE SURE YOU:  Understand these instructions.  Will watch your condition.  Will get help right away if you are not doing well or get worse.   This information is not intended to replace advice given to you by your health care provider. Make sure you discuss any questions you have with your health care provider.   Document Released: 03/24/2005 Document Revised: 04/14/2014 Document Reviewed: 08/21/2010 Elsevier Interactive Patient Education [employer]
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
## 2015-02-22 NOTE — ED Notes (Signed)
Secondary assessment being completed and documented by PA 

## 2015-03-09 ENCOUNTER — Encounter: Payer: Self-pay | Admitting: Family Medicine

## 2015-03-09 ENCOUNTER — Ambulatory Visit (INDEPENDENT_AMBULATORY_CARE_PROVIDER_SITE_OTHER): Payer: 59 | Admitting: Family Medicine

## 2015-03-09 VITALS — BP 110/70 | Ht 68.0 in | Wt 130.0 lb

## 2015-03-09 DIAGNOSIS — M25512 Pain in left shoulder: Secondary | ICD-10-CM

## 2015-03-09 MED ORDER — METHYLPREDNISOLONE ACETATE 40 MG/ML IJ SUSP
40.0000 mg | Freq: Once | INTRAMUSCULAR | Status: AC
  Administered 2015-03-09: 40 mg via INTRA_ARTICULAR

## 2015-03-09 NOTE — Assessment & Plan Note (Addendum)
Appears to be muscular in origin including trapezius/infraspinatus. - Trigger point injection today  -There are no red flags on exam his testing is benign. If continues for the next 4-6 weeks, would consider x-rays and possible further imaging with MRI/MRI to further evaluate a labral pathology which would probably be the only other thing that would be of etiology. -Continue with muscle relaxer and pain medication when necessary. Can consider heat and stretches to the area. -If he returns we'll also consider subacromial injection for bursitis but does not appear to have impingement testing positive today.   Aspiration/Injection Procedure Note Michael Hood [birth year]  Procedure: Injection Indications: Trigger Point Pain/Trap   Procedure Details Consent: Risks of procedure as well as the alternatives and risks of each were explained to the (patient/caregiver).  Consent for procedure obtained. Time Out: Verified patient identification, verified procedure, site/side was marked, verified correct patient position, special equipment/implants available, medications/allergies/relevent history reviewed, required imaging and test results available.  Performed.  The area was cleaned with iodine and alcohol swabs.    The L trapezius was injected using 1 cc's of 40mg  Depomedrol and 1 cc's of 1% lidocaine with a 30 1/2".    A sterile dressing was applied.  Patient did tolerate procedure well.
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
## 2015-07-01 ENCOUNTER — Emergency Department (HOSPITAL_COMMUNITY)
Admission: EM | Admit: 2015-07-01 | Discharge: 2015-07-01 | Discharge status: Against Medical Advice | Payer: Self-pay | Source: Home / Self Care

## 2015-07-04 ENCOUNTER — Ambulatory Visit: Payer: Self-pay | Admitting: Family Medicine

## 2015-07-06 ENCOUNTER — Ambulatory Visit: Payer: Self-pay | Admitting: Medical

## 2017-01-27 ENCOUNTER — Emergency Department (HOSPITAL_COMMUNITY)
Admission: EM | Admit: 2017-01-27 | Discharge: 2017-01-27 | Disposition: A | Discharge status: Home / Self Care | Payer: 59 | Attending: Emergency Medicine | Admitting: Emergency Medicine

## 2017-01-27 ENCOUNTER — Encounter (HOSPITAL_COMMUNITY): Payer: Self-pay | Admitting: Emergency Medicine

## 2017-01-27 ENCOUNTER — Emergency Department (HOSPITAL_COMMUNITY): Payer: 59

## 2017-01-27 DIAGNOSIS — Z23 Encounter for immunization: Secondary | ICD-10-CM | POA: Insufficient documentation

## 2017-01-27 DIAGNOSIS — Y9389 Activity, other specified: Secondary | ICD-10-CM | POA: Diagnosis not present

## 2017-01-27 DIAGNOSIS — S81801A Unspecified open wound, right lower leg, initial encounter: Secondary | ICD-10-CM | POA: Diagnosis not present

## 2017-01-27 DIAGNOSIS — Y998 Other external cause status: Secondary | ICD-10-CM | POA: Diagnosis not present

## 2017-01-27 DIAGNOSIS — Y92099 Unspecified place in other non-institutional residence as the place of occurrence of the external cause: Secondary | ICD-10-CM | POA: Insufficient documentation

## 2017-01-27 DIAGNOSIS — W3400XA Accidental discharge from unspecified firearms or gun, initial encounter: Secondary | ICD-10-CM | POA: Diagnosis not present

## 2017-01-27 MED ORDER — FENTANYL CITRATE (PF) 100 MCG/2ML IJ SOLN
100.0000 ug | Freq: Once | INTRAMUSCULAR | Status: DC
  Filled 2017-01-27: qty 2

## 2017-01-27 MED ORDER — TETANUS-DIPHTH-ACELL PERTUSSIS 5-2.5-18.5 LF-MCG/0.5 IM SUSP
0.5000 mL | Freq: Once | INTRAMUSCULAR | Status: AC
  Administered 2017-01-27: 0.5 mL via INTRAMUSCULAR
  Filled 2017-01-27: qty 0.5

## 2017-01-27 MED ORDER — FENTANYL CITRATE (PF) 100 MCG/2ML IJ SOLN
INTRAMUSCULAR | Status: AC
  Filled 2017-01-27: qty 2

## 2017-01-27 MED ORDER — MORPHINE SULFATE 15 MG PO TABS: 15.0000 mg | ORAL_TABLET | ORAL | 0 refills | Status: DC | PRN

## 2017-01-27 MED ORDER — FENTANYL CITRATE (PF) 100 MCG/2ML IJ SOLN
INTRAMUSCULAR | Status: AC | PRN
  Administered 2017-01-27 (×2): 100 ug via INTRAVENOUS

## 2017-01-27 NOTE — Progress Notes (Signed)
Orthopedic Tech Progress Note Patient Details:  Michael Hood 01-20-1991 [id number]  Ortho Devices Type of Ortho Device: Crutches Ortho Device/Splint Interventions: Ordered, Adjustment   Michael Hood, Michael Hood 01/27/2017, 8:09 PM
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
## 2017-01-27 NOTE — Discharge Instructions (Signed)
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
## 2017-01-28 ENCOUNTER — Encounter: Payer: Self-pay | Admitting: Family Medicine

## 2017-02-21 ENCOUNTER — Encounter (HOSPITAL_COMMUNITY): Payer: Self-pay | Admitting: Family Medicine

## 2017-02-21 ENCOUNTER — Ambulatory Visit (HOSPITAL_COMMUNITY)
Admission: EM | Admit: 2017-02-21 | Discharge: 2017-02-21 | Disposition: A | Discharge status: Home / Self Care | Payer: 59

## 2017-02-21 DIAGNOSIS — Z5189 Encounter for other specified aftercare: Secondary | ICD-10-CM | POA: Diagnosis not present

## 2017-02-21 DIAGNOSIS — S71101A Unspecified open wound, right thigh, initial encounter: Secondary | ICD-10-CM | POA: Diagnosis not present

## 2017-02-21 DIAGNOSIS — W3400XA Accidental discharge from unspecified firearms or gun, initial encounter: Secondary | ICD-10-CM | POA: Diagnosis not present

## 2017-02-21 DIAGNOSIS — S71131A Puncture wound without foreign body, right thigh, initial encounter: Secondary | ICD-10-CM
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
## 2017-02-21 NOTE — Discharge Instructions (Signed)
The wound appears to be healing quite well. No signs of infection. The area of tenderness and hardness adjacent to the exit wound is a hematoma or blood clot from the original injury. This blood clot is not harmful he will slowly absorbed and go away. Continue to keep the wounds clean with soap and water. [facility]
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
## 2021-02-12 ENCOUNTER — Other Ambulatory Visit: Payer: Self-pay

## 2021-02-12 DIAGNOSIS — M79645 Pain in left finger(s): Secondary | ICD-10-CM | POA: Insufficient documentation

## 2021-02-12 DIAGNOSIS — J45909 Unspecified asthma, uncomplicated: Secondary | ICD-10-CM | POA: Insufficient documentation

## 2021-02-13 ENCOUNTER — Emergency Department (HOSPITAL_BASED_OUTPATIENT_CLINIC_OR_DEPARTMENT_OTHER): Payer: Self-pay

## 2021-02-13 ENCOUNTER — Encounter (HOSPITAL_BASED_OUTPATIENT_CLINIC_OR_DEPARTMENT_OTHER): Payer: Self-pay

## 2021-02-13 ENCOUNTER — Emergency Department (HOSPITAL_BASED_OUTPATIENT_CLINIC_OR_DEPARTMENT_OTHER)
Admission: EM | Admit: 2021-02-13 | Discharge: 2021-02-13 | Disposition: A | Discharge status: Home / Self Care | Payer: Self-pay | Attending: Emergency Medicine | Admitting: Emergency Medicine

## 2021-02-13 DIAGNOSIS — M79645 Pain in left finger(s): Secondary | ICD-10-CM

## 2021-02-13 MED ORDER — IBUPROFEN 600 MG PO TABS: 600.0000 mg | ORAL_TABLET | Freq: Four times a day (QID) | ORAL | 0 refills | Status: DC | PRN

## 2021-02-13 NOTE — ED Triage Notes (Signed)
Pt reports Left hand pain after a fall 1 week ago.

## 2021-02-13 NOTE — ED Provider Notes (Signed)
[facility] EMERGENCY DEPT Provider Note   CSN: [id number] Arrival date & time: 02/12/21  2357     History Chief Complaint  Patient presents with   Hand Pain    Michael Hood is a 30 y.o. male.  HPI     This is a 30 year old male who presents with left thumb pain.  Patient reports 1 week ago he fell directly onto his left thumb on a concrete step.  He has persistent pain.  Pain is worse with range of motion and palpation.  He has been taking ibuprofen at home with minimal relief.  Rates his pain as 6 out of 10.  He is right-handed.  Denies other injury.  Past Medical History:  Diagnosis Date   Asthma    as a child   Retained orthopedic hardware 03/2012   left hand    Patient Active Problem List   Diagnosis Date Noted   Pain in left shoulder 03/09/2015    Past Surgical History:  Procedure Laterality Date   HARDWARE REMOVAL  03/17/2012   Procedure: HARDWARE REMOVAL;  Surgeon: Nicki Reaper, MD;  Location: [facility];  Service: Orthopedics;  Laterality: Left;  Removal IM Rod Left hand    PERCUTANEOUS PINNING METACARPAL FRACTURE  01/05/2012   left 5th       No family history on file.  Social History   Tobacco Use   Smoking status: Never   Smokeless tobacco: Never  Substance Use Topics   Alcohol use: No    Comment: 2 x/week   Drug use: No    Home Medications Prior to Admission medications   Medication Sig Start Date End Date Taking? Authorizing Provider  ibuprofen (ADVIL) 600 MG tablet Take 1 tablet (600 mg total) by mouth every 6 (six) hours as needed. 02/13/21  Yes Mekala Winger, Mayer Masker, MD  cephALEXin (KEFLEX) 500 MG capsule 2 caps po bid x 7 days 09/01/13   Junious Silk, PA-C  HYDROcodone-acetaminophen (NORCO) 5-325 MG per tablet Take 1-2 tablets by mouth every 6 (six) hours as needed for moderate pain. 11/18/13   Charm Rings, MD  methocarbamol (ROBAXIN) 500 MG tablet Take 1 tablet (500 mg total) by mouth 2 (two) times  daily. 02/22/15   Barrett, Rolm Gala, PA-C  morphine (MSIR) 15 MG tablet Take 1 tablet (15 mg total) by mouth every 4 (four) hours as needed for severe pain. 01/27/17   Melene Plan, DO  naproxen (NAPROSYN) 500 MG tablet Take 1 tablet (500 mg total) by mouth 2 (two) times daily. 02/22/15   Barrett, Rolm Gala, PA-C  ondansetron (ZOFRAN) 4 MG tablet Take 1 tablet (4 mg total) by mouth every 6 (six) hours. 09/01/13   Junious Silk, PA-C  oxyCODONE-acetaminophen (PERCOCET/ROXICET) 5-325 MG per tablet Take 1-2 tablets by mouth every 6 (six) hours as needed for moderate pain or severe pain. 09/01/13   Junious Silk, PA-C  oxyCODONE-acetaminophen (PERCOCET/ROXICET) 5-325 MG per tablet Take 1 tablet by mouth every 6 (six) hours as needed for moderate pain or severe pain (severe pain). 09/08/13   Shirleen Schirmer, PA-C    Allergies    Patient has no known allergies.  Review of Systems   Review of Systems  Constitutional:  Negative for fever.  Musculoskeletal:        Left thumb pain  Neurological:  Negative for weakness and numbness.  All other systems reviewed and are negative.  Physical Exam Updated Vital Signs BP (!) 121/92 (BP Location: Right Arm)   Pulse  68   Temp 98.1 F (36.7 C) (Oral)   Resp 14   SpO2 100%   Physical Exam Vitals and nursing note reviewed.  Constitutional:      Appearance: He is well-developed. He is not ill-appearing.  HENT:     Head: Normocephalic and atraumatic.     Nose: Nose normal.     Mouth/Throat:     Mouth: Mucous membranes are moist.  Eyes:     Pupils: Pupils are equal, round, and reactive to light.  Cardiovascular:     Rate and Rhythm: Normal rate and regular rhythm.  Pulmonary:     Effort: Pulmonary effort is normal. No respiratory distress.  Abdominal:     Palpations: Abdomen is soft.  Musculoskeletal:     Cervical back: Neck supple.     Comments: Focused examination of the left hand without obvious deformity, he has an abrasion over the left thumb,  tenderness to palpation over the proximal phalanx, no snuffbox tenderness, normal range of motion, neurovascular intact  Lymphadenopathy:     Cervical: No cervical adenopathy.  Skin:    General: Skin is warm and dry.  Neurological:     Mental Status: He is alert and oriented to person, place, and time.  Psychiatric:        Mood and Affect: Mood normal.    ED Results / Procedures / Treatments   Labs (all labs ordered are listed, but only abnormal results are displayed) Labs Reviewed - No data to display  EKG None  Radiology DG Hand Complete Left  Result Date: 02/13/2021 CLINICAL DATA:  Trauma to the left hand. EXAM: LEFT HAND - COMPLETE 3+ VIEW COMPARISON:  None. FINDINGS: There is no evidence of fracture or dislocation. There is no evidence of arthropathy or other focal bone abnormality. Soft tissues are unremarkable. IMPRESSION: Negative. Electronically Signed   By: Elgie Collard M.D.   On: 02/13/2021 00:34   DG Finger Thumb Left  Result Date: 02/13/2021 CLINICAL DATA:  Trauma to the left thumb. EXAM: LEFT THUMB 2+V COMPARISON:  Left hand radiograph dated 02/13/2021. FINDINGS: There is no evidence of fracture or dislocation. There is no evidence of arthropathy or other focal bone abnormality. Soft tissues are unremarkable. IMPRESSION: Negative. Electronically Signed   By: Elgie Collard M.D.   On: 02/13/2021 02:19    Procedures Procedures   Medications Ordered in ED Medications - No data to display  ED Course  I have reviewed the triage vital signs and the nursing notes.  Pertinent labs & imaging results that were available during my care of the patient were reviewed by me and considered in my medical decision making (see chart for details).    MDM Rules/Calculators/A&P                           Patient presents with an injury to the left thumb.  He is nontoxic and vital signs are reassuring.  He has tenderness to palpation over the proximal phalanx but no obvious  deformities and normal range of motion normal neurovascular exam.  Given time since injury, will have low suspicion for fracture.  X-rays obtained and reviewed and show no evidence of acute fracture.  Given that he fell on it, he could have sustained a sprain or ligamentous injury.  Will provide a thumb spica for support.  Continue ibuprofen at home.  After history, exam, and medical workup I feel the patient has been appropriately medically screened and  is safe for discharge home. Pertinent diagnoses were discussed with the patient. Patient was given return precautions.  Final Clinical Impression(s) / ED Diagnoses Final diagnoses:  Thumb pain, left    Rx / DC Orders ED Discharge Orders          Ordered    ibuprofen (ADVIL) 600 MG tablet  Every 6 hours PRN        02/13/21 0224             Shon Baton, MD 02/13/21 [phone]

## 2021-02-13 NOTE — Discharge Instructions (Signed)
You were seen for an injury to the left thumb.  Your x-rays do not show any fracture.  You could have a sprain.  Use thumb spica for comfort.  Take ibuprofen as needed for pain.  Apply ice.

## 2022-01-01 ENCOUNTER — Other Ambulatory Visit: Payer: Self-pay

## 2022-01-01 ENCOUNTER — Ambulatory Visit (HOSPITAL_COMMUNITY)
Admission: EM | Admit: 2022-01-01 | Discharge: 2022-01-01 | Disposition: A | Discharge status: Home / Self Care | Payer: BC Managed Care – PPO | Attending: Internal Medicine | Admitting: Internal Medicine

## 2022-01-01 ENCOUNTER — Encounter (HOSPITAL_COMMUNITY): Payer: Self-pay | Admitting: Emergency Medicine

## 2022-01-01 DIAGNOSIS — H6123 Impacted cerumen, bilateral: Secondary | ICD-10-CM

## 2022-01-01 NOTE — ED Triage Notes (Signed)
Left ear pain, stuffy.  Reports ear popping.  Then seemed normal .  Yesterday symptoms started again.   Denies placing anything in ear

## 2022-01-01 NOTE — ED Notes (Signed)
Provider at bedside for discharge-reviewed ears post irrigation

## 2022-01-01 NOTE — ED Provider Notes (Signed)
[facility]   [id number] 01/01/22 Arrival Time: 8413  ASSESSMENT & PLAN:  1. Bilateral impacted cerumen    -Patient feels much better after bilateral earwax removal.  Hearing improved.  Tympanic membrane's visualized and intact status post irrigation.  Recommended over-the-counter Debrox in the future.  Follow-up with primary as needed.  No orders of the defined types were placed in this encounter.  Discharge Instructions   None       Reviewed expectations re: course of current medical issues. Questions answered. Outlined signs and symptoms indicating need for more acute intervention. Patient verbalized understanding. After Visit Summary given.   SUBJECTIVE: Pleasant 31 year old male comes to urgent care to be evaluated for left ear pain.  His left ear has been bothering him for about 3 weeks.  He reports intermittent episodes of decreased hearing and dull ache behind the ear.  No drainage or itchiness in the ear.  No inciting trauma or or injury to the ear but he does wear air pods frequently.  No history of ear problems prior.  No preceding symptoms of fever, headache, nasal congestion, coughing, sneezing, sore throat.  No LMP for male patient. Past Surgical History:  Procedure Laterality Date   HARDWARE REMOVAL  03/17/2012   Procedure: HARDWARE REMOVAL;  Surgeon: Nicki Reaper, MD;  Location: [facility];  Service: Orthopedics;  Laterality: Left;  Removal IM Rod Left hand    PERCUTANEOUS PINNING METACARPAL FRACTURE  01/05/2012   left 5th     OBJECTIVE:  Vitals:   01/01/22 0944  BP: 116/78  Pulse: 78  Resp: 18  Temp: 98.2 F (36.8 C)  TempSrc: Oral  SpO2: 98%     Physical Exam Vitals reviewed.  Constitutional:      Appearance: Normal appearance. He is not ill-appearing.  HENT:     Head: Normocephalic.     Right Ear: There is impacted cerumen.     Left Ear: There is impacted cerumen.     Nose: No congestion or rhinorrhea.      Mouth/Throat:     Mouth: Mucous membranes are moist.     Pharynx: No oropharyngeal exudate or posterior oropharyngeal erythema.  Cardiovascular:     Rate and Rhythm: Normal rate.  Pulmonary:     Effort: Pulmonary effort is normal.  Musculoskeletal:     Cervical back: Normal range of motion. No tenderness.  Skin:    General: Skin is warm.  Neurological:     General: No focal deficit present.      Labs: Results for orders placed or performed during the hospital encounter of 03/17/12  Hemoglobin-hemacue, POC  Result Value Ref Range   Hemoglobin 14.6 13.0 - 17.0 g/dL   Labs Reviewed - No data to display  Imaging: No results found.   No Known Allergies                                             Past Medical History:  Diagnosis Date   Asthma    as a child   Retained orthopedic hardware 03/2012   left hand    Social History   Socioeconomic History   Marital status: Significant Other    Spouse name: Not on file   Number of children: Not on file   Years of education: Not on file   Highest education level: Not on file  Occupational  History   Not on file  Tobacco Use   Smoking status: Never   Smokeless tobacco: Never  Vaping Use   Vaping Use: Never used  Substance and Sexual Activity   Alcohol use: Yes    Comment: 2 x/week   Drug use: No   Sexual activity: Not on file  Other Topics Concern   Not on file  Social History Narrative   ** Merged History Encounter **       Social Determinants of Health   Financial Resource Strain: Not on file  Food Insecurity: Not on file  Transportation Needs: Not on file  Physical Activity: Not on file  Stress: Not on file  Social Connections: Not on file  Intimate Partner Violence: Not on file    Family History  Problem Relation Age of Onset   Healthy Mother    Healthy Father       Ladarion Munyon, Dorian Pod, MD 01/01/22 1141

## 2022-01-27 ENCOUNTER — Ambulatory Visit (INDEPENDENT_AMBULATORY_CARE_PROVIDER_SITE_OTHER): Payer: Self-pay | Admitting: Podiatry

## 2022-01-27 ENCOUNTER — Encounter: Payer: Self-pay | Admitting: Podiatry

## 2022-01-27 ENCOUNTER — Ambulatory Visit (INDEPENDENT_AMBULATORY_CARE_PROVIDER_SITE_OTHER): Payer: Self-pay

## 2022-01-27 DIAGNOSIS — M7752 Other enthesopathy of left foot: Secondary | ICD-10-CM

## 2022-01-27 DIAGNOSIS — L84 Corns and callosities: Secondary | ICD-10-CM

## 2022-01-27 DIAGNOSIS — M21612 Bunion of left foot: Secondary | ICD-10-CM

## 2022-01-27 DIAGNOSIS — M21622 Bunionette of left foot: Secondary | ICD-10-CM

## 2022-01-27 DIAGNOSIS — M21619 Bunion of unspecified foot: Secondary | ICD-10-CM

## 2022-01-27 DIAGNOSIS — M2042 Other hammer toe(s) (acquired), left foot: Secondary | ICD-10-CM

## 2022-01-27 NOTE — Progress Notes (Signed)
Subjective:   Patient ID: Michael Hood, male   DOB: 31 y.o.   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  Patient does not smoke and is very active   Review of Systems  All other systems reviewed and are negative.       Objective:  Physical Exam Vitals and nursing note reviewed.  Constitutional:      Appearance: He is well-developed.  Pulmonary:     Effort: Pulmonary effort is normal.  Musculoskeletal:        General: Normal range of motion.  Skin:    General: Skin is warm.  Neurological:     Mental Status: He is alert.     Neurovascular status intact muscle strength found to be adequate range of motion within normal limits.  Patient is found to have exquisite discomfort head of fifth metatarsal left with prominent fifth metatarsal head structural bunion deformity left and digital deformity with keratotic lesion digit 3 left.  Has tried numerous trimming techniques padding techniques and other modalities without relief of symptoms along with wider shoes.  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  I do think given the years and years of pain and failure to respond conservatively that surgical intervention is indicated and he wants to get this done.  I recommended 1/5 metatarsal head resection left with excision of plantar mass left with wide excision technique structural bunion correction with distal osteotomy digital hammertoe third left.   Patient wants procedures I allowed him to read consent form going over alternative treatments complications and patient scheduled for outpatient surgery understands total recovery can take upwards of 6 months.  He understands no guarantee as far as success and lesions can recur and wants surgery and today I dispensed air fracture walker which was fitted properly to the lower leg with instructions on getting used to it prior to the procedure  X-rays indicate that there is structural bunion deformity with elevation of the metatarsal angle of approximately 15 degrees tailor's bunion deformity distal hammertoe deformity third left

## 2022-03-24 ENCOUNTER — Telehealth: Payer: Self-pay | Admitting: Podiatry

## 2022-03-24 NOTE — Telephone Encounter (Addendum)
DOS: 04/22/2022  [employer] ([employer])  [place] Bunionectomy Lt (28296) Metatarsal Osteotomy 5th Lt (28113) Hammertoe Repair Distal 3rd Lt (28285) Exc. Benign Lesion Over 4.0 cm Lt (11426)  Deductible: $100 with $0 met Out-of-Pocket: $1,000 with $0 met CoInsurance:   Prior authorization is not required per Liz R.  Call Reference #: [id number] 

## 2022-04-10 ENCOUNTER — Telehealth: Payer: Self-pay

## 2022-04-10 NOTE — Telephone Encounter (Signed)
Jermal called to cancel his surgery with Dr. Paulla Dolly on 04/22/2022. 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 He needs to wait till August before he can reschedule. He will see Dr. Paulla Dolly again in June for another surgery consult. Notified Dr. Paulla Dolly and Caren Griffins with [place]

## 2022-04-14 ENCOUNTER — Ambulatory Visit (INDEPENDENT_AMBULATORY_CARE_PROVIDER_SITE_OTHER): Payer: PRIVATE HEALTH INSURANCE | Admitting: Podiatry

## 2022-04-14 ENCOUNTER — Encounter: Payer: Self-pay | Admitting: Podiatry

## 2022-04-14 DIAGNOSIS — M7752 Other enthesopathy of left foot: Secondary | ICD-10-CM | POA: Diagnosis not present

## 2022-04-14 DIAGNOSIS — M21622 Bunionette of left foot: Secondary | ICD-10-CM | POA: Diagnosis not present

## 2022-04-14 MED ORDER — TRIAMCINOLONE ACETONIDE 10 MG/ML IJ SUSP: 10.0000 mg | Freq: Once | INTRAMUSCULAR | Status: DC

## 2022-04-14 NOTE — Progress Notes (Signed)
Subjective:   Patient ID: Michael Hood, male   DOB: 32 y.o.   MRN: [id number]   HPI Patient states he cannot do surgery yet desperate for relief of the pain in the outside of his left foot   ROS      Objective:  Physical Exam  Neurovascular status intact with inflammation of the fifth MPJ left fluid buildup keratotic lesion formation and structural bunion deformity     Assessment:  Inflammatory capsulitis of the fifth MPJ left along with structural bunion deformity left     Plan:  Reviewed condition and reviewed bunion tailor's bunion and I do think ultimate surgical be necessary and again reviewed the recovery.  For him for his surgery.  I went ahead today did sterile prep and injected the fifth MPJ capsule plantar 3 mg Dexasone Kenalog 5 mg Xylocaine debrided lesion reappoint to recheck

## 2022-04-28 ENCOUNTER — Other Ambulatory Visit: Payer: PRIVATE HEALTH INSURANCE

## 2022-05-12 ENCOUNTER — Other Ambulatory Visit: Payer: PRIVATE HEALTH INSURANCE

## 2022-06-01 ENCOUNTER — Emergency Department (HOSPITAL_COMMUNITY): Payer: BC Managed Care – PPO

## 2022-06-01 ENCOUNTER — Encounter (HOSPITAL_COMMUNITY)
Admission: EM | Disposition: A | Discharge status: Home / Self Care | Payer: Self-pay | Source: Home / Self Care | Attending: Orthopaedic Surgery

## 2022-06-01 ENCOUNTER — Ambulatory Visit (HOSPITAL_BASED_OUTPATIENT_CLINIC_OR_DEPARTMENT_OTHER): Payer: Self-pay | Admitting: Orthopaedic Surgery

## 2022-06-01 ENCOUNTER — Inpatient Hospital Stay (HOSPITAL_COMMUNITY)
Admission: EM | Admit: 2022-06-01 | Discharge: 2022-06-03 | DRG: 956 | Disposition: A | Discharge status: Home / Self Care | Payer: BC Managed Care – PPO | Attending: Orthopaedic Surgery | Admitting: Orthopaedic Surgery

## 2022-06-01 ENCOUNTER — Other Ambulatory Visit: Payer: Self-pay

## 2022-06-01 ENCOUNTER — Emergency Department (HOSPITAL_COMMUNITY): Payer: BC Managed Care – PPO | Admitting: Anesthesiology

## 2022-06-01 ENCOUNTER — Encounter (HOSPITAL_COMMUNITY): Payer: Self-pay | Admitting: Anesthesiology

## 2022-06-01 DIAGNOSIS — Z791 Long term (current) use of non-steroidal anti-inflammatories (NSAID): Secondary | ICD-10-CM

## 2022-06-01 DIAGNOSIS — S72322A Displaced transverse fracture of shaft of left femur, initial encounter for closed fracture: Principal | ICD-10-CM | POA: Diagnosis present

## 2022-06-01 DIAGNOSIS — Y92410 Unspecified street and highway as the place of occurrence of the external cause: Secondary | ICD-10-CM

## 2022-06-01 DIAGNOSIS — Z4889 Encounter for other specified surgical aftercare: Secondary | ICD-10-CM

## 2022-06-01 DIAGNOSIS — J45909 Unspecified asthma, uncomplicated: Secondary | ICD-10-CM | POA: Diagnosis present

## 2022-06-01 DIAGNOSIS — S72309A Unspecified fracture of shaft of unspecified femur, initial encounter for closed fracture: Secondary | ICD-10-CM | POA: Diagnosis present

## 2022-06-01 DIAGNOSIS — S32422A Displaced fracture of posterior wall of left acetabulum, initial encounter for closed fracture: Secondary | ICD-10-CM | POA: Diagnosis not present

## 2022-06-01 DIAGNOSIS — Z79899 Other long term (current) drug therapy: Secondary | ICD-10-CM

## 2022-06-01 DIAGNOSIS — S72332A Displaced oblique fracture of shaft of left femur, initial encounter for closed fracture: Secondary | ICD-10-CM

## 2022-06-01 DIAGNOSIS — Z23 Encounter for immunization: Secondary | ICD-10-CM

## 2022-06-01 HISTORY — PX: INTRAMEDULLARY (IM) NAIL INTERTROCHANTERIC: SHX5875

## 2022-06-01 LAB — CBC
HCT: 39.7 % (ref 39.0–52.0)
Hemoglobin: 14.9 g/dL (ref 13.0–17.0)
MCH: 29.7 pg (ref 26.0–34.0)
MCHC: 37.5 g/dL — ABNORMAL HIGH (ref 30.0–36.0)
MCV: 79.2 fL — ABNORMAL LOW (ref 80.0–100.0)
Platelets: 311 10*3/uL (ref 150–400)
RBC: 5.01 MIL/uL (ref 4.22–5.81)
RDW: 13.3 % (ref 11.5–15.5)
WBC: 9.7 10*3/uL (ref 4.0–10.5)
nRBC: 0 % (ref 0.0–0.2)

## 2022-06-01 LAB — URINALYSIS, ROUTINE W REFLEX MICROSCOPIC
Bilirubin Urine: NEGATIVE
Glucose, UA: NEGATIVE mg/dL
Hgb urine dipstick: NEGATIVE
Ketones, ur: NEGATIVE mg/dL
Leukocytes,Ua: NEGATIVE
Nitrite: NEGATIVE
Protein, ur: NEGATIVE mg/dL
Specific Gravity, Urine: 1.028 (ref 1.005–1.030)
pH: 7 (ref 5.0–8.0)

## 2022-06-01 LAB — I-STAT CHEM 8, ED
BUN: 10 mg/dL (ref 6–20)
Calcium, Ion: 1.22 mmol/L (ref 1.15–1.40)
Chloride: 101 mmol/L (ref 98–111)
Creatinine, Ser: 1.1 mg/dL (ref 0.61–1.24)
Glucose, Bld: 98 mg/dL (ref 70–99)
HCT: 44 % (ref 39.0–52.0)
Hemoglobin: 15 g/dL (ref 13.0–17.0)
Potassium: 3.4 mmol/L — ABNORMAL LOW (ref 3.5–5.1)
Sodium: 143 mmol/L (ref 135–145)
TCO2: 26 mmol/L (ref 22–32)

## 2022-06-01 LAB — PROTIME-INR
INR: 1.1 (ref 0.8–1.2)
Prothrombin Time: 14.5 seconds (ref 11.4–15.2)

## 2022-06-01 LAB — COMPREHENSIVE METABOLIC PANEL
ALT: 20 U/L (ref 0–44)
AST: 31 U/L (ref 15–41)
Albumin: 4.1 g/dL (ref 3.5–5.0)
Alkaline Phosphatase: 55 U/L (ref 38–126)
Anion gap: 11 (ref 5–15)
BUN: 9 mg/dL (ref 6–20)
CO2: 25 mmol/L (ref 22–32)
Calcium: 8.8 mg/dL — ABNORMAL LOW (ref 8.9–10.3)
Chloride: 103 mmol/L (ref 98–111)
Creatinine, Ser: 1.14 mg/dL (ref 0.61–1.24)
GFR, Estimated: >60 mL/min (ref >60)
Glucose, Bld: 102 mg/dL — ABNORMAL HIGH (ref 70–99)
Potassium: 3.4 mmol/L — ABNORMAL LOW (ref 3.5–5.1)
Sodium: 139 mmol/L (ref 135–145)
Total Bilirubin: 0.8 mg/dL (ref 0.3–1.2)
Total Protein: 6.8 g/dL (ref 6.5–8.1)

## 2022-06-01 LAB — RAPID URINE DRUG SCREEN, HOSP PERFORMED
Amphetamines: NOT DETECTED
Barbiturates: NOT DETECTED
Benzodiazepines: NOT DETECTED
Cocaine: NOT DETECTED
Opiates: NOT DETECTED
Tetrahydrocannabinol: POSITIVE — AB

## 2022-06-01 LAB — LACTIC ACID, PLASMA: Lactic Acid, Venous: 2.9 mmol/L (ref 0.5–1.9)

## 2022-06-01 LAB — ETHANOL: Alcohol, Ethyl (B): 212 mg/dL — ABNORMAL HIGH (ref <10)

## 2022-06-01 LAB — SAMPLE TO BLOOD BANK

## 2022-06-01 SURGERY — FIXATION, FRACTURE, INTERTROCHANTERIC, WITH INTRAMEDULLARY ROD
Anesthesia: General | Site: Hip | Laterality: Left

## 2022-06-01 MED ORDER — PROMETHAZINE HCL 25 MG/ML IJ SOLN: 6.2500 mg | INTRAMUSCULAR | Status: DC | PRN

## 2022-06-01 MED ORDER — PHENYLEPHRINE 80 MCG/ML (10ML) SYRINGE FOR IV PUSH (FOR BLOOD PRESSURE SUPPORT)
PREFILLED_SYRINGE | INTRAVENOUS | Status: DC | PRN
  Administered 2022-06-01 (×2): 80 ug via INTRAVENOUS
  Administered 2022-06-01: 160 ug via INTRAVENOUS

## 2022-06-01 MED ORDER — ACETAMINOPHEN 500 MG PO TABS
1000.0000 mg | ORAL_TABLET | Freq: Four times a day (QID) | ORAL | Status: AC
  Administered 2022-06-01 (×2): 1000 mg via ORAL
  Filled 2022-06-01 (×4): qty 2

## 2022-06-01 MED ORDER — TETANUS-DIPHTH-ACELL PERTUSSIS 5-2.5-18.5 LF-MCG/0.5 IM SUSY
0.5000 mL | PREFILLED_SYRINGE | Freq: Once | INTRAMUSCULAR | Status: AC
  Administered 2022-06-01: 0.5 mL via INTRAMUSCULAR
  Filled 2022-06-01: qty 0.5

## 2022-06-01 MED ORDER — AMISULPRIDE (ANTIEMETIC) 5 MG/2ML IV SOLN: 10.0000 mg | Freq: Once | INTRAVENOUS | Status: DC | PRN

## 2022-06-01 MED ORDER — SODIUM CHLORIDE 0.9 % IV SOLN: INTRAVENOUS | Status: DC

## 2022-06-01 MED ORDER — CHLORHEXIDINE GLUCONATE 0.12 % MT SOLN
OROMUCOSAL | Status: AC
  Administered 2022-06-01: 15 mL via OROMUCOSAL
  Filled 2022-06-01: qty 15

## 2022-06-01 MED ORDER — DEXMEDETOMIDINE HCL IN NACL 80 MCG/20ML IV SOLN
INTRAVENOUS | Status: AC
  Filled 2022-06-01: qty 20

## 2022-06-01 MED ORDER — CEFAZOLIN SODIUM-DEXTROSE 2-4 GM/100ML-% IV SOLN
2.0000 g | Freq: Three times a day (TID) | INTRAVENOUS | Status: AC
  Administered 2022-06-01 – 2022-06-02 (×2): 2 g via INTRAVENOUS
  Filled 2022-06-01 (×2): qty 100

## 2022-06-01 MED ORDER — MIDAZOLAM HCL 2 MG/2ML IJ SOLN
INTRAMUSCULAR | Status: DC | PRN
  Administered 2022-06-01: 2 mg via INTRAVENOUS

## 2022-06-01 MED ORDER — ACETAMINOPHEN 325 MG PO TABS: 325.0000 mg | ORAL_TABLET | Freq: Once | ORAL | Status: DC | PRN

## 2022-06-01 MED ORDER — DEXAMETHASONE SODIUM PHOSPHATE 10 MG/ML IJ SOLN
INTRAMUSCULAR | Status: DC | PRN
  Administered 2022-06-01: 10 mg via INTRAVENOUS

## 2022-06-01 MED ORDER — LIDOCAINE 2% (20 MG/ML) 5 ML SYRINGE
INTRAMUSCULAR | Status: DC | PRN
  Administered 2022-06-01: 40 mg via INTRAVENOUS

## 2022-06-01 MED ORDER — PROPOFOL 10 MG/ML IV BOLUS
INTRAVENOUS | Status: DC | PRN
  Administered 2022-06-01: 130 mg via INTRAVENOUS

## 2022-06-01 MED ORDER — LACTATED RINGERS IV BOLUS
2000.0000 mL | Freq: Once | INTRAVENOUS | Status: AC
  Administered 2022-06-01: 2000 mL via INTRAVENOUS

## 2022-06-01 MED ORDER — HALOPERIDOL LACTATE 5 MG/ML IJ SOLN
5.0000 mg | INTRAMUSCULAR | Status: DC | PRN
  Administered 2022-06-01 (×2): 5 mg via INTRAVENOUS
  Filled 2022-06-01 (×2): qty 1

## 2022-06-01 MED ORDER — FENTANYL CITRATE (PF) 250 MCG/5ML IJ SOLN
INTRAMUSCULAR | Status: AC
  Filled 2022-06-01: qty 5

## 2022-06-01 MED ORDER — ONDANSETRON HCL 4 MG/2ML IJ SOLN
INTRAMUSCULAR | Status: DC | PRN
  Administered 2022-06-01: 4 mg via INTRAVENOUS

## 2022-06-01 MED ORDER — ASPIRIN 325 MG PO TABS
325.0000 mg | ORAL_TABLET | Freq: Every day | ORAL | Status: DC
  Administered 2022-06-01 – 2022-06-03 (×3): 325 mg via ORAL
  Filled 2022-06-01 (×3): qty 1

## 2022-06-01 MED ORDER — ACETAMINOPHEN 325 MG PO TABS
325.0000 mg | ORAL_TABLET | Freq: Four times a day (QID) | ORAL | Status: DC | PRN
  Administered 2022-06-02 – 2022-06-03 (×2): 650 mg via ORAL
  Filled 2022-06-01 (×2): qty 2

## 2022-06-01 MED ORDER — HYDROMORPHONE HCL 1 MG/ML IJ SOLN
INTRAMUSCULAR | Status: DC | PRN
  Administered 2022-06-01 (×2): .25 mg via INTRAVENOUS

## 2022-06-01 MED ORDER — HYDROMORPHONE HCL 1 MG/ML IJ SOLN: 0.2500 mg | INTRAMUSCULAR | Status: DC | PRN

## 2022-06-01 MED ORDER — OXYCODONE HCL 5 MG PO TABS
10.0000 mg | ORAL_TABLET | ORAL | Status: DC | PRN
  Administered 2022-06-02 – 2022-06-03 (×3): 10 mg via ORAL
  Administered 2022-06-03: 15 mg via ORAL
  Filled 2022-06-01: qty 3
  Filled 2022-06-01: qty 2
  Filled 2022-06-01: qty 3

## 2022-06-01 MED ORDER — HYDROMORPHONE HCL 1 MG/ML IJ SOLN
0.5000 mg | INTRAMUSCULAR | Status: DC | PRN
  Administered 2022-06-02 – 2022-06-03 (×3): 1 mg via INTRAVENOUS
  Filled 2022-06-01 (×5): qty 1

## 2022-06-01 MED ORDER — ONDANSETRON HCL 4 MG/2ML IJ SOLN: 4.0000 mg | Freq: Four times a day (QID) | INTRAMUSCULAR | Status: DC | PRN

## 2022-06-01 MED ORDER — OXYCODONE HCL 5 MG PO TABS
5.0000 mg | ORAL_TABLET | ORAL | Status: DC | PRN
  Administered 2022-06-01 – 2022-06-03 (×3): 10 mg via ORAL
  Filled 2022-06-01 (×5): qty 2

## 2022-06-01 MED ORDER — FENTANYL CITRATE (PF) 250 MCG/5ML IJ SOLN
INTRAMUSCULAR | Status: DC | PRN
  Administered 2022-06-01: 100 ug via INTRAVENOUS
  Administered 2022-06-01 (×3): 50 ug via INTRAVENOUS
  Administered 2022-06-01: 100 ug via INTRAVENOUS

## 2022-06-01 MED ORDER — LACTATED RINGERS IV SOLN: INTRAVENOUS | Status: DC

## 2022-06-01 MED ORDER — 0.9 % SODIUM CHLORIDE (POUR BTL) OPTIME
TOPICAL | Status: DC | PRN
  Administered 2022-06-01: 1000 mL

## 2022-06-01 MED ORDER — HYDROMORPHONE HCL 1 MG/ML IJ SOLN
INTRAMUSCULAR | Status: AC
  Filled 2022-06-01: qty 0.5

## 2022-06-01 MED ORDER — ENOXAPARIN SODIUM 40 MG/0.4ML IJ SOSY
40.0000 mg | PREFILLED_SYRINGE | INTRAMUSCULAR | Status: DC
  Administered 2022-06-01 – 2022-06-02 (×2): 40 mg via SUBCUTANEOUS
  Filled 2022-06-01 (×2): qty 0.4

## 2022-06-01 MED ORDER — CHLORHEXIDINE GLUCONATE 0.12 % MT SOLN: 15.0000 mL | Freq: Once | OROMUCOSAL | Status: AC

## 2022-06-01 MED ORDER — ACETAMINOPHEN 10 MG/ML IV SOLN
1000.0000 mg | Freq: Once | INTRAVENOUS | Status: DC | PRN
  Administered 2022-06-01: 1000 mg via INTRAVENOUS

## 2022-06-01 MED ORDER — DEXMEDETOMIDINE HCL IN NACL 80 MCG/20ML IV SOLN
INTRAVENOUS | Status: DC | PRN
  Administered 2022-06-01: 12 ug via BUCCAL
  Administered 2022-06-01 (×3): 8 ug via BUCCAL

## 2022-06-01 MED ORDER — ONDANSETRON HCL 4 MG PO TABS: 4.0000 mg | ORAL_TABLET | Freq: Four times a day (QID) | ORAL | Status: DC | PRN

## 2022-06-01 MED ORDER — DIPHENHYDRAMINE HCL 50 MG/ML IJ SOLN
INTRAMUSCULAR | Status: AC
  Filled 2022-06-01: qty 1

## 2022-06-01 MED ORDER — DOCUSATE SODIUM 100 MG PO CAPS
100.0000 mg | ORAL_CAPSULE | Freq: Two times a day (BID) | ORAL | Status: DC
  Administered 2022-06-01 – 2022-06-03 (×4): 100 mg via ORAL
  Filled 2022-06-01 (×4): qty 1

## 2022-06-01 MED ORDER — ACETAMINOPHEN 160 MG/5ML PO SOLN: 325.0000 mg | Freq: Once | ORAL | Status: DC | PRN

## 2022-06-01 MED ORDER — ACETAMINOPHEN 10 MG/ML IV SOLN
INTRAVENOUS | Status: AC
  Filled 2022-06-01: qty 100

## 2022-06-01 MED ORDER — CEFAZOLIN SODIUM-DEXTROSE 2-3 GM-%(50ML) IV SOLR
INTRAVENOUS | Status: DC | PRN
  Administered 2022-06-01: 2 g via INTRAVENOUS

## 2022-06-01 MED ORDER — SUCCINYLCHOLINE 20MG/ML (10ML) SYRINGE FOR MEDFUSION PUMP - OPTIME
INTRAMUSCULAR | Status: DC | PRN
  Administered 2022-06-01: 120 mg via INTRAVENOUS

## 2022-06-01 MED ORDER — ORAL CARE MOUTH RINSE: 15.0000 mL | Freq: Once | OROMUCOSAL | Status: AC

## 2022-06-01 MED ORDER — CEFAZOLIN SODIUM-DEXTROSE 2-4 GM/100ML-% IV SOLN
INTRAVENOUS | Status: AC
  Filled 2022-06-01: qty 100

## 2022-06-01 MED ORDER — MEPERIDINE HCL 25 MG/ML IJ SOLN: 6.2500 mg | INTRAMUSCULAR | Status: DC | PRN

## 2022-06-01 MED ORDER — SUGAMMADEX SODIUM 200 MG/2ML IV SOLN
INTRAVENOUS | Status: DC | PRN
  Administered 2022-06-01: 200 mg via INTRAVENOUS

## 2022-06-01 MED ORDER — IOHEXOL 350 MG/ML SOLN
75.0000 mL | Freq: Once | INTRAVENOUS | Status: AC | PRN
  Administered 2022-06-01: 75 mL via INTRAVENOUS

## 2022-06-01 MED ORDER — DIPHENHYDRAMINE HCL 50 MG/ML IJ SOLN
12.5000 mg | Freq: Once | INTRAMUSCULAR | Status: AC
  Administered 2022-06-01: 12.5 mg via INTRAVENOUS

## 2022-06-01 MED ORDER — PROPOFOL 10 MG/ML IV BOLUS
INTRAVENOUS | Status: AC
  Filled 2022-06-01: qty 20

## 2022-06-01 MED ORDER — ROCURONIUM BROMIDE 10 MG/ML (PF) SYRINGE
PREFILLED_SYRINGE | INTRAVENOUS | Status: DC | PRN
  Administered 2022-06-01: 40 mg via INTRAVENOUS

## 2022-06-01 MED ORDER — MIDAZOLAM HCL 2 MG/2ML IJ SOLN
INTRAMUSCULAR | Status: AC
  Filled 2022-06-01: qty 2

## 2022-06-01 SURGICAL SUPPLY — 37 items
BAG COUNTER SPONGE SURGICOUNT (BAG) IMPLANT
BIT DRILL CALIBRTD FREE HND4.3 (BIT) IMPLANT
BIT DRILL SHORT CALI 4.9 CANN (DRILL) IMPLANT
CHLORAPREP W/TINT 26 (MISCELLANEOUS) ×1 IMPLANT
COVER PERINEAL POST (MISCELLANEOUS) ×1 IMPLANT
COVER SURGICAL LIGHT HANDLE (MISCELLANEOUS) ×1 IMPLANT
DRAPE C-ARMOR (DRAPES) ×1 IMPLANT
DRAPE STERI IOBAN 125X83 (DRAPES) ×1 IMPLANT
DRESSING MEPILEX FLEX 4X4 (GAUZE/BANDAGES/DRESSINGS) ×2 IMPLANT
DRILL CALIBRATED FREE HAND 4.3 (BIT) ×1
DRILL SHORT CALI 4.9 CANN (DRILL) ×2
DRSG MEPILEX BORDER 4X8 (GAUZE/BANDAGES/DRESSINGS) IMPLANT
DRSG MEPILEX FLEX 4X4 (GAUZE/BANDAGES/DRESSINGS) ×4
ELECT REM PT RETURN 15FT ADLT (MISCELLANEOUS) ×1 IMPLANT
GAUZE XEROFORM 5X9 LF (GAUZE/BANDAGES/DRESSINGS) ×1 IMPLANT
GLOVE BIOGEL PI IND STRL 6.5 (GLOVE) ×1 IMPLANT
GLOVE BIOGEL PI IND STRL 8 (GLOVE) ×1 IMPLANT
GLOVE ECLIPSE 6.0 STRL STRAW (GLOVE) ×1 IMPLANT
GLOVE INDICATOR 8.0 STRL GRN (GLOVE) ×1 IMPLANT
GOWN STRL REUS W/ TWL LRG LVL3 (GOWN DISPOSABLE) ×2 IMPLANT
GOWN STRL REUS W/TWL LRG LVL3 (GOWN DISPOSABLE) ×2
GUIDEWIRE 3.0X100MM BALL TIP (WIRE) IMPLANT
KIT BASIN OR (CUSTOM PROCEDURE TRAY) ×1 IMPLANT
KIT TURNOVER KIT A (KITS) IMPLANT
NAIL FEM IM 9.3X380 LT (Nail) IMPLANT
NS IRRIG 1000ML POUR BTL (IV SOLUTION) ×1 IMPLANT
PACK GENERAL/GYN (CUSTOM PROCEDURE TRAY) ×1 IMPLANT
PIN GUIDE 3.0 THREADED (PIN) IMPLANT
SCREW BONE 5.0X35MM CORTICAL (Screw) IMPLANT
SCREW FEM FA STD 6X85 (Screw) IMPLANT
SCREW HEX HEAD 3.5X32.5 (Screw) ×1 IMPLANT
SCREW Z NAIL 5.0X32.5 CORT (Screw) IMPLANT
STAPLER VISISTAT 35W (STAPLE) ×1 IMPLANT
SUT VIC AB 0 CT1 36 (SUTURE) ×1 IMPLANT
SUT VIC AB 1 CT1 36 (SUTURE) ×1 IMPLANT
SUT VIC AB 2-0 CT1 27 (SUTURE) ×1
SUT VIC AB 2-0 CT1 TAPERPNT 27 (SUTURE) ×1 IMPLANT

## 2022-06-01 NOTE — Discharge Instructions (Signed)
     Discharge Instructions    Attending Surgeon: Vanetta Mulders, MD Office Phone Number: [phone]   Diagnosis and Procedures:    Surgeries Performed: Left femoral nailing  Discharge Plan:    Diet: Resume usual diet. Begin with light or bland foods.  Drink plenty of fluids.  Activity:  50% Weight bearing left leg. You are advised to go home directly from the hospital or surgical center. Restrict your activities.  GENERAL INSTRUCTIONS: 1.  Please apply ice to your wound to help with swelling and inflammation. This will improve your comfort and your overall recovery following surgery.     2. Please call Dr. Eddie Dibbles office at [phone] with questions Monday-Friday during business hours. If no one answers, please leave a message and someone should get back to the patient within 24 hours. For emergencies please call 911 or proceed to the emergency room.   3. Patient to notify surgical team if experiences any of the following: Bowel/Bladder dysfunction, uncontrolled pain, nerve/muscle weakness, incision with increased drainage or redness, nausea/vomiting and Fever greater than 101.0 F.  Be alert for signs of infection including redness, streaking, odor, fever or chills. Be alert for excessive pain or bleeding and notify your surgeon immediately.  WOUND INSTRUCTIONS:   Leave your dressing, cast, or splint in place until your post operative visit.  Keep it clean and dry.  Always keep the incision clean and dry until the staples/sutures are removed. If there is no drainage from the incision you should keep it open to air. If there is drainage from the incision you must keep it covered at all times until the drainage stops  Do not soak in a bath tub, hot tub, pool, lake or other body of water until 21 days after your surgery and your incision is completely dry and healed.  If you have removable sutures (or staples) they must be removed 10-14 days (unless otherwise instructed)  from the day of your surgery.     1)  Elevate the extremity as much as possible.  2)  Keep the dressing clean and dry.  3)  Please call us if the dressing becomes wet or dirty.  4)  If you are experiencing worsening pain or worsening swelling, please call.     MEDICATIONS: Resume all previous home medications at the previous prescribed dose and frequency unless otherwise noted Start taking the  pain medications on an as-needed basis as prescribed  Please taper down pain medication over the next week following surgery.  Ideally you should not require a refill of any narcotic pain medication.  Take pain medication with food to minimize nausea. In addition to the prescribed pain medication, you may take over-the-counter pain relievers such as Tylenol.  Do NOT take additional tylenol if your pain medication already has tylenol in it.  Aspirin 381m daily for four weeks.      FOLLOWUP INSTRUCTIONS: 1. Follow up at the Physical Therapy Clinic 3-4 days following surgery. This appointment should be scheduled unless other arrangements have been made.The Physical Therapy scheduling number is [phone] an appointment has not already been arranged.  2. Contact Dr. BEddie Dibblesoffice during office hours at [phone] the practice after hours line at [phone] non-emergencies. For medical emergencies call 911.   Discharge Location: Home

## 2022-06-01 NOTE — Progress Notes (Signed)
Orthopedic Tech Progress Note Patient Details:  Michael Hood Jul 14, 1990 [id number]  Musculoskeletal Traction Type of Traction: Bucks Skin Traction Traction Location: lle Traction Weight: 10 lbs   Post Interventions Patient Tolerated: Well Instructions Provided: Care of device, Adjustment of device  Karolee Stamps 06/01/2022, 6:27 AM
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
## 2022-06-01 NOTE — Brief Op Note (Signed)
   Brief Op Note  Date of Surgery: 06/01/2022  Preoperative Diagnosis: Left Hip Fx  Postoperative Diagnosis: same  Procedure: Procedure(s): INTRAMEDULLARY (IM) NAIL INTERTROCHANTERIC  Implants: Implant Name Type Inv. Item Serial No. Manufacturer Lot No. LRB No. Used Action  NAIL FEM IM 9.3X380 LT - [id number] Nail NAIL FEM IM 9.3X380 LT  [employer](ORTH,TRAU,BIO,SG) [id number] Left 1 Implanted  SCREW FEM FA STD 6X85 - [id number] Screw SCREW FEM FA STD 6X85  [employer](ORTH,TRAU,BIO,SG) [id number] Left 1 Implanted  SCREW FEM FA STD 6X85 - [id number] Screw SCREW FEM FA STD 6X85  [employer](ORTH,TRAU,BIO,SG) [id number] Left 1 Implanted  SCREW HEX HEAD 3.5X32.5 - [id number] Screw SCREW HEX HEAD 3.5X32.5  [employer](ORTH,TRAU,BIO,SG) [id number] Left 1 Implanted  SCREW BONE 5.0X35MM CORTICAL - [id number] Screw SCREW BONE 5.0X35MM CORTICAL  [employer](ORTH,TRAU,BIO,SG) [id number] Left 1 Implanted    Surgeons: Surgeon(s): Vanetta Mulders, MD  Anesthesia: General    Estimated Blood Loss: See anesthesia record  Complications: None  Condition to PACU: Stable  Yevonne Pax, MD 06/01/2022 12:55 PM

## 2022-06-01 NOTE — Anesthesia Postprocedure Evaluation (Signed)
Anesthesia Post Note  Patient: Michael Hood  Procedure(s) Performed: INTRAMEDULLARY (IM) NAIL INTERTROCHANTERIC (Left: Hip)     Patient location during evaluation: PACU Anesthesia Type: General Level of consciousness: awake and alert Pain management: pain level controlled Vital Signs Assessment: post-procedure vital signs reviewed and stable Respiratory status: spontaneous breathing, nonlabored ventilation, respiratory function stable and patient connected to nasal cannula oxygen Cardiovascular status: blood pressure returned to baseline and stable Postop Assessment: no apparent nausea or vomiting Anesthetic complications: no  No notable events documented.  Last Vitals:  Vitals:   06/01/22 1400 06/01/22 1415  BP: (!) 141/103 (!) 139/96  Pulse: 91 90  Resp:    Temp:    SpO2: 95% 95%    Last Pain:  Vitals:   06/01/22 1345  TempSrc:   PainSc: [place] Babetta Paterson

## 2022-06-01 NOTE — Anesthesia Preprocedure Evaluation (Addendum)
Anesthesia Evaluation  Patient identified by MRN, date of birth, ID band Patient awake    Reviewed: Allergy & Precautions, NPO status , Patient's Chart, lab work & pertinent test results  Airway Mallampati: I  TM Distance: >3 FB Neck ROM: Full    Dental  (+) Dental Advisory Given, Loose,    Pulmonary asthma    breath sounds clear to auscultation       Cardiovascular negative cardio ROS  Rhythm:Regular Rate:Normal     Neuro/Psych negative neurological ROS  negative psych ROS   GI/Hepatic negative GI ROS, Neg liver ROS,,,  Endo/Other  negative endocrine ROS    Renal/GU negative Renal ROS     Musculoskeletal negative musculoskeletal ROS (+)    Abdominal   Peds  Hematology negative hematology ROS (+)   Anesthesia Other Findings   Reproductive/Obstetrics                             Anesthesia Physical Anesthesia Plan  ASA: 2  Anesthesia Plan: General   Post-op Pain Management: Minimal or no pain anticipated   Induction: Intravenous  PONV Risk Score and Plan: 3 and Ondansetron, Midazolam and Dexamethasone  Airway Management Planned: Oral ETT  Additional Equipment: None  Intra-op Plan:   Post-operative Plan: Extubation in OR  Informed Consent: I have reviewed the patients History and Physical, chart, labs and discussed the procedure including the risks, benefits and alternatives for the proposed anesthesia with the patient or authorized representative who has indicated his/her understanding and acceptance.     Dental advisory given  Plan Discussed with: CRNA  Anesthesia Plan Comments:        Anesthesia Quick Evaluation

## 2022-06-01 NOTE — Progress Notes (Signed)
Received an order for fentanyl 50 mcg from Dr.  Robert. Aly  CRNA stated she was taking the pt. now to OR and not to give it. Medication not given.

## 2022-06-01 NOTE — ED Notes (Signed)
Pt was combative and aggressive. Haldol given
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
## 2022-06-01 NOTE — ED Notes (Signed)
Pt removed C-collar and VS monitors. Rolling around in bed. Not following commands and not verbally responding to staff.
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
## 2022-06-01 NOTE — ED Notes (Signed)
Pt placed in traction by Ortho tech and RN

## 2022-06-01 NOTE — Anesthesia Procedure Notes (Signed)
Procedure Name: Intubation Date/Time: 06/01/2022 11:13 AM  Performed by: Rande Brunt, CRNAPre-anesthesia Checklist: Patient identified, Emergency Drugs available, Suction available and Patient being monitored Patient Re-evaluated:Patient Re-evaluated prior to induction Oxygen Delivery Method: Circle System Utilized Preoxygenation: Pre-oxygenation with 100% oxygen Induction Type: IV induction, Cricoid Pressure applied and Rapid sequence Laryngoscope Size: Mac and 4 Grade View: Grade I Tube type: Oral Tube size: 7.5 mm Number of attempts: 1 Airway Equipment and Method: Stylet and Oral airway Placement Confirmation: ETT inserted through vocal cords under direct vision, positive ETCO2 and breath sounds checked- equal and bilateral Secured at: 21 cm Tube secured with: Tape Dental Injury: Teeth and Oropharynx as per pre-operative assessment

## 2022-06-01 NOTE — Op Note (Addendum)
Date of Surgery: 06/01/2022  INDICATIONS: Michael Hood is a 32 y.o.-year-old male with left femoral shaft fracture.  The risk and benefits of the procedure were discussed in detail and documented in the pre-operative evaluation.   PREOPERATIVE DIAGNOSIS: 1. Left femoral shaft fracture  POSTOPERATIVE DIAGNOSIS: Same.  PROCEDURE: 1. Left femoral nailing  SURGEON: Yevonne Pax MD  ASSISTANT: Raynelle Fanning, ATC  ANESTHESIA:  general  IV FLUIDS AND URINE: See anesthesia record.  ANTIBIOTICS: Ancef  ESTIMATED BLOOD LOSS: 50 mL.  IMPLANTS:  Implant Name Type Inv. Item Serial No. Manufacturer Lot No. LRB No. Used Action  NAIL FEM IM 9.3X380 LT - [id number] Nail NAIL FEM IM 9.3X380 LT  [employer](ORTH,TRAU,BIO,SG) [id number] Left 1 Implanted  SCREW FEM FA STD 6X85 - [id number] Screw SCREW FEM FA STD 6X85  [employer](ORTH,TRAU,BIO,SG) [id number] Left 1 Implanted  SCREW FEM FA STD 6X85 - [id number] Screw SCREW FEM FA STD 6X85  [employer](ORTH,TRAU,BIO,SG) [id number] Left 1 Implanted  SCREW HEX HEAD 3.5X32.5 - [id number] Screw SCREW HEX HEAD 3.5X32.5  [employer](ORTH,TRAU,BIO,SG) [id number] Left 1 Implanted  SCREW BONE 5.0X35MM CORTICAL - [id number] Screw SCREW BONE 5.0X35MM CORTICAL  [employer](ORTH,TRAU,BIO,SG) [id number] Left 1 Implanted    DRAINS: None  CULTURES: None  COMPLICATIONS: none  DESCRIPTION OF PROCEDURE:  The patient's chart/medical history was reviewed and decision was made to administer peri-operative trans-exemic acid.  The patient was identified in the preoperative holding area and the correct site was marked according universal protocol with nursing, and was subsequently taken back to the operating room.  Anesthesia was induced.  Antibiotics were given 1 hour prior to skin incision.  The patient was placed on the Hana table.  The traction post was placed.  Gross traction was able to provide the best reduction.  The patient was subsequently prepped and draped in  the usual sterile fashion.   Final timeout was performed.  A posterolateral approach to the greater trochanter was used.  This was done 3 cm proximal to the greater trochanter.  10 blade was used to incise through skin and IT band.  Blunt dissection was performed down to the level of the greater trochanter.  The pin was placed under direct fluoroscopic visualization at the center of the greater trochanter at the tip.  This was malleted into place down to the level of the lesser trochanter.  Opening reamer was then used.  A ball-tipped guidewire was then place to distal metaphyseal bone in the femur.  This was passed across the fracture site.  The ball wire was then measured and the appropriate size nail size long nail in 65mm was taken.  Size 10.5 reamer was used over the guidewire.  Nail was introduced.  At this point as the nail was being placed a fracture line was noticed through the intertrochanteric line.  This was minimally displaced and the decision was made to continue passing the nail.  The guidewire was removed.  2 wires were passed through the proximal portion of the nail and through the jig into the femoral neck aiming posteriorly for the past proximal femoral bone.  These were measured into screws were placed.  Distal interlocks were then placed.  This was done during perfect circle technique.  15 blade was used to incise through skin and IT band.  Drill was then used bicortically.  Screw sizes were measured and then 2 screws were placed both in static fashion.  The jig was removed.  The wounds were thoroughly irrigated.  Final  fluoroscopy was confirmed good reduction on AP and laterals.  Wounds were closed in layers of 0 Vicryl 2-0 Vicryl and staples.  An Aquacel dressing was placed. All counts were correct at the end of the case. The patient was awoken and taken to the PACU without complication.      POSTOPERATIVE PLAN: He will be 50% weightbearing for 2 weeks given his posterior acetabular  fracture.  At this point we will advance to full weightbearing.  I will plan to see him back in 2 weeks for suture removal.  He will be placed on aspirin 325 mg while inpatient.  Yevonne Pax, MD 12:56 PM
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
## 2022-06-01 NOTE — Brief Op Note (Signed)
   Brief Op Note  Date of Surgery: 06/01/2022  Preoperative Diagnosis: Left Hip Fx  Postoperative Diagnosis: same  Procedure: Procedure(s): INTRAMEDULLARY (IM) NAIL INTERTROCHANTERIC  Implants: Implant Name Type Inv. Item Serial No. Manufacturer Lot No. LRB No. Used Action  NAIL FEM IM 9.3X380 LT - [id number] Nail NAIL FEM IM 9.3X380 LT  [employer](ORTH,TRAU,BIO,SG) [id number] Left 1 Implanted  SCREW FEM FA STD 6X85 - [id number] Screw SCREW FEM FA STD 6X85  [employer](ORTH,TRAU,BIO,SG) [id number] Left 1 Implanted  SCREW FEM FA STD 6X85 - [id number] Screw SCREW FEM FA STD 6X85  [employer](ORTH,TRAU,BIO,SG) [id number] Left 1 Implanted  SCREW HEX HEAD 3.5X32.5 - [id number] Screw SCREW HEX HEAD 3.5X32.5  [employer](ORTH,TRAU,BIO,SG) [id number] Left 1 Implanted  SCREW BONE 5.0X35MM CORTICAL - [id number] Screw SCREW BONE 5.0X35MM CORTICAL  [employer](ORTH,TRAU,BIO,SG) [id number] Left 1 Implanted    Surgeons: Surgeon(s): Vanetta Mulders, MD  Anesthesia: General    Estimated Blood Loss: See anesthesia record  Complications: None  Condition to PACU: Stable  Yevonne Pax, MD 06/01/2022 12:58 PM

## 2022-06-01 NOTE — Interval H&P Note (Signed)
History and Physical Interval Note:  06/01/2022 10:55 AM  Michael Hood  has presented today for surgery, with the diagnosis of Left Hip Fx.  The various methods of treatment have been discussed with the patient and family. After consideration of risks, benefits and other options for treatment, the patient has consented to  Procedure(s): INTRAMEDULLARY (IM) NAIL INTERTROCHANTERIC (Left) as a surgical intervention.  The patient's history has been reviewed, patient examined, no change in status, stable for surgery.  I have reviewed the patient's chart and labs.  Questions were answered to the patient's satisfaction.     Vanetta Mulders

## 2022-06-01 NOTE — Transfer of Care (Signed)
Immediate Anesthesia Transfer of Care Note  Patient: Michael Hood  Procedure(s) Performed: INTRAMEDULLARY (IM) NAIL INTERTROCHANTERIC (Left: Hip)  Patient Location: PACU  Anesthesia Type:General  Level of Consciousness: drowsy and patient cooperative  Airway & Oxygen Therapy: Patient Spontanous Breathing and Patient connected to face mask oxygen  Post-op Assessment: Report given to RN, Post -op Vital signs reviewed and stable, and Patient moving all extremities  Post vital signs: Reviewed and stable  Last Vitals:  Vitals Value Taken Time  BP 143/80 06/01/22 1318  Temp    Pulse 86 06/01/22 1320  Resp 7 06/01/22 1320  SpO2 100 % 06/01/22 1320  Vitals shown include unvalidated device data.  Last Pain:  Vitals:   06/01/22 1044  TempSrc:   PainSc: 10-Worst pain ever       Patient with welts all over body that were not seen after induction. Possibly reaction to sugammadex. Orders placed for bendadryl.   Complications: No notable events documented.

## 2022-06-01 NOTE — ED Notes (Signed)
Pt refusing vital signs

## 2022-06-02 ENCOUNTER — Other Ambulatory Visit (HOSPITAL_COMMUNITY): Payer: Self-pay

## 2022-06-02 ENCOUNTER — Encounter (HOSPITAL_BASED_OUTPATIENT_CLINIC_OR_DEPARTMENT_OTHER): Payer: Self-pay

## 2022-06-02 ENCOUNTER — Encounter (HOSPITAL_COMMUNITY): Payer: Self-pay | Admitting: Orthopaedic Surgery

## 2022-06-02 LAB — CBC
HCT: 31.8 % — ABNORMAL LOW (ref 39.0–52.0)
Hemoglobin: 11.9 g/dL — ABNORMAL LOW (ref 13.0–17.0)
MCH: 29.5 pg (ref 26.0–34.0)
MCHC: 37.4 g/dL — ABNORMAL HIGH (ref 30.0–36.0)
MCV: 78.9 fL — ABNORMAL LOW (ref 80.0–100.0)
Platelets: 248 10*3/uL (ref 150–400)
RBC: 4.03 MIL/uL — ABNORMAL LOW (ref 4.22–5.81)
RDW: 13.1 % (ref 11.5–15.5)
WBC: 11.6 10*3/uL — ABNORMAL HIGH (ref 4.0–10.5)
nRBC: 0 % (ref 0.0–0.2)

## 2022-06-02 LAB — BASIC METABOLIC PANEL
Anion gap: 12 (ref 5–15)
BUN: 9 mg/dL (ref 6–20)
CO2: 22 mmol/L (ref 22–32)
Calcium: 9.1 mg/dL (ref 8.9–10.3)
Chloride: 97 mmol/L — ABNORMAL LOW (ref 98–111)
Creatinine, Ser: 0.79 mg/dL (ref 0.61–1.24)
GFR, Estimated: >60 mL/min (ref >60)
Glucose, Bld: 114 mg/dL — ABNORMAL HIGH (ref 70–99)
Potassium: 3.9 mmol/L (ref 3.5–5.1)
Sodium: 131 mmol/L — ABNORMAL LOW (ref 135–145)

## 2022-06-02 MED ORDER — OXYCODONE HCL 5 MG PO TABS
5.0000 mg | ORAL_TABLET | ORAL | 0 refills | Status: DC | PRN
  Filled 2022-06-02: qty 20, 4d supply, fill #0

## 2022-06-02 MED ORDER — ASPIRIN 325 MG PO TBEC
325.0000 mg | DELAYED_RELEASE_TABLET | Freq: Every day | ORAL | 0 refills | Status: AC
  Filled 2022-06-02: qty 30, 30d supply, fill #0

## 2022-06-02 MED ORDER — ACETAMINOPHEN 500 MG PO TABS
500.0000 mg | ORAL_TABLET | Freq: Three times a day (TID) | ORAL | 0 refills | Status: AC
  Filled 2022-06-02: qty 30, 10d supply, fill #0

## 2022-06-02 MED ORDER — IBUPROFEN 800 MG PO TABS
800.0000 mg | ORAL_TABLET | Freq: Three times a day (TID) | ORAL | 0 refills | Status: AC
  Filled 2022-06-02: qty 30, 10d supply, fill #0

## 2022-06-02 NOTE — Progress Notes (Signed)
   Subjective:  Patient reports pain as moderate.  Asking for pain medication this a.m.  Tolerating diet.  Having a hard time getting comfortable  Objective:   VITALS:   Vitals:   06/01/22 2106 06/02/22 0028 06/02/22 0500 06/02/22 0751  BP: 108/66 111/73 130/77 135/83  Pulse: (!) 103 (!) 109 91 (!) 101  Resp: 16 16 18 16  $ Temp: 98.4 F (36.9 C) 98.5 F (36.9 C) 98.3 F (36.8 C) 98.7 F (37.1 C)  TempSrc: Oral Oral Oral Oral  SpO2: 99% 100% 96% 100%  Weight:      Height:        There are some saturation on the proximal dressing.  Left lower extremity leg lengths are equal.  Fires EHL as well as tibialis anterior and gastrocsoleus.  Sensation intact in all distributions of the left foot  Lab Results  Component Value Date   WBC 9.7 06/01/2022   HGB 14.9 06/01/2022   HCT 39.7 06/01/2022   MCV 79.2 (L) 06/01/2022   PLT 311 06/01/2022     Assessment/Plan:  1 Day Post-Op status post left femoral nailing  - Patient to work with PT to optimize mobilization safely - DVT ppx - SCDs, ambulation, discharge - Postoperative Abx: Ancef x 2 additional doses given -50% weightbearing with crutches on the left leg - Pain control - multimodal pain management, ATC acetaminophen in conjunction with as needed narcotic (oxycodone), although this should be minimized with other modalities  - Discharge planning pending CM, appreciate coordination   Michael Hood 06/02/2022, 7:52 AM

## 2022-06-02 NOTE — Progress Notes (Signed)
Gave family member DC instructions AVS to review. Pt needs to be cleared by PT prior to going home.
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
## 2022-06-02 NOTE — Plan of Care (Signed)
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
## 2022-06-02 NOTE — Progress Notes (Signed)
Patient requested not to be bother for VS or lab drawn until AM.
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
## 2022-06-03 DIAGNOSIS — Y92410 Unspecified street and highway as the place of occurrence of the external cause: Secondary | ICD-10-CM | POA: Diagnosis not present

## 2022-06-03 DIAGNOSIS — Z791 Long term (current) use of non-steroidal anti-inflammatories (NSAID): Secondary | ICD-10-CM | POA: Diagnosis not present

## 2022-06-03 DIAGNOSIS — S32422A Displaced fracture of posterior wall of left acetabulum, initial encounter for closed fracture: Secondary | ICD-10-CM | POA: Diagnosis present

## 2022-06-03 DIAGNOSIS — Z23 Encounter for immunization: Secondary | ICD-10-CM | POA: Diagnosis not present

## 2022-06-03 DIAGNOSIS — Z79899 Other long term (current) drug therapy: Secondary | ICD-10-CM | POA: Diagnosis not present

## 2022-06-03 DIAGNOSIS — J45909 Unspecified asthma, uncomplicated: Secondary | ICD-10-CM | POA: Diagnosis present

## 2022-06-03 DIAGNOSIS — S72322A Displaced transverse fracture of shaft of left femur, initial encounter for closed fracture: Secondary | ICD-10-CM | POA: Diagnosis present

## 2022-06-03 NOTE — Progress Notes (Signed)
Explained discharge instructions to patient. Reviewed follow up appointment and next medication administration times. Also reviewed education. Patient verbalized having an understanding for instructions given. All belongings are in the patient's possession to include TOC meds. IV was removed. No other needs verbalized. Patient doesn't have any clothing to wear home for discharge. His ride will come at 3pm to bring his clothing. He is not discharge lounge appropriate due to mobility challenges as well pain issues. He will discharge at 3pm.
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
## 2022-06-03 NOTE — Progress Notes (Signed)
   Subjective:  Patient reports pain as mild today.  Asking for pain medication this a.m.  Tolerating diet. Worked with PT.  Objective:   VITALS:   Vitals:   06/02/22 0751 06/02/22 1621 06/02/22 2043 06/03/22 0503  BP: 135/83 134/82 (!) 133/91 128/83  Pulse: (!) 101 (!) 107 (!) 101 (!) 107  Resp: 16 16    Temp: 98.7 F (37.1 C) 99 F (37.2 C) 98.7 F (37.1 C) 98.9 F (37.2 C)  TempSrc: Oral Oral Oral Oral  SpO2: 100% 100% 99% 100%  Weight:      Height:        There are some saturation on the proximal dressing.  Left lower extremity leg lengths are equal.  Fires EHL as well as tibialis anterior and gastrocsoleus.  Sensation intact in all distributions of the left foot  Lab Results  Component Value Date   WBC 11.6 (H) 06/02/2022   HGB 11.9 (L) 06/02/2022   HCT 31.8 (L) 06/02/2022   MCV 78.9 (L) 06/02/2022   PLT 248 06/02/2022     Assessment/Plan:  2 Days Post-Op status post left femoral nailing  - Patient to work with PT to optimize mobilization safely - DVT ppx - SCDs, ambulation, discharge - Postoperative Abx: Ancef x 2 additional doses given -50% weightbearing with crutches on the left leg - Pain control - multimodal pain management, ATC acetaminophen in conjunction with as needed narcotic (oxycodone), although this should be minimized with other modalities  - Discharge planning pending CM, plan home today  Daryl Quiros 06/03/2022, 7:03 AM

## 2022-06-04 ENCOUNTER — Telehealth: Payer: Self-pay

## 2022-06-04 ENCOUNTER — Encounter (HOSPITAL_COMMUNITY): Payer: Self-pay | Admitting: Orthopaedic Surgery

## 2022-06-04 NOTE — Telephone Encounter (Signed)
LMOM stating work note has been uploaded to [employer] and informed him of follow up appt 3/14

## 2022-06-04 NOTE — Telephone Encounter (Signed)
Error

## 2022-06-04 NOTE — Telephone Encounter (Signed)
Patients sister Danae Chen called stating that patient is needing a work note stating how long he will need to be out of work due to his surgery.  CB# [phone].  Please advise.  Thank you.

## 2022-06-11 ENCOUNTER — Ambulatory Visit: Payer: BC Managed Care – PPO | Admitting: Student

## 2022-06-11 VITALS — BP 113/68 | HR 87 | Temp 98.7°F | Ht 67.0 in | Wt 130.0 lb

## 2022-06-11 DIAGNOSIS — S32422D Displaced fracture of posterior wall of left acetabulum, subsequent encounter for fracture with routine healing: Secondary | ICD-10-CM

## 2022-06-11 DIAGNOSIS — Z0000 Encounter for general adult medical examination without abnormal findings: Secondary | ICD-10-CM | POA: Insufficient documentation

## 2022-06-11 NOTE — Assessment & Plan Note (Signed)
Pt denied flu vaccine and screening for HIV and Hep C due to pain. Will attempt at next visit.

## 2022-06-11 NOTE — Patient Instructions (Addendum)
Thank you so much for coming to the clinic today!   The number for the orthopedic doctor is [phone], and his name is Dr. Sammuel Hines. They should be able to provide more pain medications. In the mean while, please continue taking the ibuprofen and tylenol.     If you have any questions please feel free to the call the clinic at anytime at [phone]. It was a pleasure seeing you!  Best, Dr. Sanjuana Mae
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
## 2022-06-12 ENCOUNTER — Telehealth: Payer: Self-pay | Admitting: Orthopaedic Surgery

## 2022-06-12 ENCOUNTER — Other Ambulatory Visit (HOSPITAL_BASED_OUTPATIENT_CLINIC_OR_DEPARTMENT_OTHER): Payer: Self-pay | Admitting: Orthopaedic Surgery

## 2022-06-12 MED ORDER — OXYCODONE HCL 5 MG PO TABS: 5.0000 mg | ORAL_TABLET | ORAL | 0 refills | Status: AC | PRN

## 2022-06-12 NOTE — Telephone Encounter (Signed)
Tried calling to advise per Dr Sammuel Hines, no answer and unable to [facility] Sending back to [facility] in case follow up needs to be made.

## 2022-06-12 NOTE — Telephone Encounter (Signed)
Patient states he need more pain medication. Please advise.

## 2022-06-19 ENCOUNTER — Other Ambulatory Visit (HOSPITAL_BASED_OUTPATIENT_CLINIC_OR_DEPARTMENT_OTHER): Payer: Self-pay

## 2022-06-19 ENCOUNTER — Ambulatory Visit (INDEPENDENT_AMBULATORY_CARE_PROVIDER_SITE_OTHER): Payer: BC Managed Care – PPO

## 2022-06-19 ENCOUNTER — Ambulatory Visit (INDEPENDENT_AMBULATORY_CARE_PROVIDER_SITE_OTHER): Payer: BC Managed Care – PPO | Admitting: Orthopaedic Surgery

## 2022-06-19 DIAGNOSIS — S72332A Displaced oblique fracture of shaft of left femur, initial encounter for closed fracture: Secondary | ICD-10-CM

## 2022-06-19 MED ORDER — IBUPROFEN 800 MG PO TABS
800.0000 mg | ORAL_TABLET | Freq: Three times a day (TID) | ORAL | 0 refills | Status: AC
  Filled 2022-06-19: qty 30, 10d supply, fill #0

## 2022-06-19 MED ORDER — ACETAMINOPHEN 500 MG PO TABS
500.0000 mg | ORAL_TABLET | Freq: Three times a day (TID) | ORAL | 0 refills | Status: AC
  Filled 2022-06-19: qty 30, 10d supply, fill #0

## 2022-06-19 NOTE — Progress Notes (Signed)
Post Operative Evaluation    Procedure/Date of Surgery: Left hip femoral nail 2/25  Interval History:   Presents today for follow-up status post left hip femoral nailing.  Unfortunately he is having persistent knee pain after the injury.  He is here today for further assessment.  He has been able to put some weight on the leg although his knee has been hurting after this.   PMH/PSH/Family History/Social History/Meds/Allergies:    Past Medical History:  Diagnosis Date   Asthma    as a child   Retained orthopedic hardware 03/2012   left hand   Past Surgical History:  Procedure Laterality Date   HARDWARE REMOVAL  03/17/2012   Procedure: HARDWARE REMOVAL;  Surgeon: Wynonia Sours, MD;  Location: [facility];  Service: Orthopedics;  Laterality: Left;  Removal IM Rod Left hand    INTRAMEDULLARY (IM) NAIL INTERTROCHANTERIC Left 06/01/2022   Procedure: INTRAMEDULLARY (IM) NAIL INTERTROCHANTERIC;  Surgeon: Vanetta Mulders, MD;  Location: [facility];  Service: Orthopedics;  Laterality: Left;   PERCUTANEOUS PINNING METACARPAL FRACTURE  01/05/2012   left 5th   Social History   Socioeconomic History   Marital status: Single    Spouse name: Not on file   Number of children: Not on file   Years of education: Not on file   Highest education level: Not on file  Occupational History   Not on file  Tobacco Use   Smoking status: Never   Smokeless tobacco: Never  Vaping Use   Vaping Use: Never used  Substance and Sexual Activity   Alcohol use: Yes    Comment: 2 x/week   Drug use: No   Sexual activity: Not on file  Other Topics Concern   Not on file  Social History Narrative   ** Merged History Encounter **       Social Determinants of Health   Financial Resource Strain: Not on file  Food Insecurity: Not on file  Transportation Needs: Not on file  Physical Activity: Not on file  Stress: Not on file  Social Connections: Not on file    Family History  Problem Relation Age of Onset   Healthy Mother    Healthy Father    No Known Allergies Current Outpatient Medications  Medication Sig Dispense Refill   acetaminophen (TYLENOL) 500 MG tablet Take 1 tablet (500 mg total) by mouth every 8 (eight) hours for 10 days. 30 tablet 0   ibuprofen (ADVIL) 800 MG tablet Take 1 tablet (800 mg total) by mouth every 8 (eight) hours for 10 days. Please take with food, please alternate with acetaminophen 30 tablet 0   aspirin EC 325 MG tablet Take 1 tablet (325 mg total) by mouth daily. 30 tablet 0   oxyCODONE (ROXICODONE) 5 MG immediate release tablet Take 1 tablet (5 mg total) by mouth every 4 (four) hours as needed for severe pain or breakthrough pain. 20 tablet 0   No current facility-administered medications for this visit.   No results found.  Review of Systems:   A ROS was performed including pertinent positives and negatives as documented in the HPI.   Musculoskeletal Exam:    There were no vitals taken for this visit.  Left hip incisions are well-appearing without erythema or drainage.  There is an effusion about the left knee.  There is a positive Lachman examination.  There is no laxity with varus or valgus stress.  Sensation is intact throughout distally.  2+ dorsalis pedis pulse  Imaging:    X-rays 2 views left femur: Status post intramedullary nailing of the left femur without evidence of complication.  I personally reviewed and interpreted the radiographs.   Assessment:   32 year old male with left femoral shaft and subtrochanteric injury status post intramedullary nailing.  At today's visit I have described that I would recommend ultimate MRI imaging of the left knee as well as I do believe he has suffered an ACL tear following his femoral shaft injury.  That being said at this time I would like him to have more healing about the femur and to progress his weightbearing with physical therapy prior to this  additional imaging.  I will plan to order physical therapy to that we can mobilize him as tolerated.  I will see him back in 4 weeks for reassessment we will consider MRI of the left knee at that time.  He may be weightbearing as tolerated from a femoral shaft perspective.  I would also like to plan to get him into a left knee brace for persistent swelling and instability  Plan :    -Physical therapy ordered and return to clinic in 1 month      I personally saw and evaluated the patient, and participated in the management and treatment plan.  Vanetta Mulders, MD Attending Physician, Orthopedic Surgery  This document was dictated using Dragon voice recognition software. A reasonable attempt at proof reading has been made to minimize errors.

## 2022-06-23 NOTE — Progress Notes (Signed)
Internal Medicine Clinic Attending  Case discussed with Dr. Nooruddin  At the time of the visit.  We reviewed the resident's history and exam and pertinent patient test results.  I agree with the assessment, diagnosis, and plan of care documented in the resident's note.  

## 2022-06-26 ENCOUNTER — Telehealth: Payer: Self-pay | Admitting: Orthopaedic Surgery

## 2022-06-26 NOTE — Telephone Encounter (Signed)
Returned call to patient informing him letter was uploaded to [employer]

## 2022-06-26 NOTE — Telephone Encounter (Signed)
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 Pt asking for call when letter is ready for pick up. Pt phone number is [phone].

## 2022-07-07 ENCOUNTER — Other Ambulatory Visit (HOSPITAL_BASED_OUTPATIENT_CLINIC_OR_DEPARTMENT_OTHER): Payer: Self-pay | Admitting: Student

## 2022-07-07 ENCOUNTER — Telehealth: Payer: Self-pay | Admitting: Orthopaedic Surgery

## 2022-07-07 MED ORDER — IBUPROFEN 800 MG PO TABS: 800.0000 mg | ORAL_TABLET | Freq: Three times a day (TID) | ORAL | 0 refills | Status: AC

## 2022-07-07 MED ORDER — ACETAMINOPHEN 500 MG PO TABS: 500.0000 mg | ORAL_TABLET | Freq: Three times a day (TID) | ORAL | 0 refills | Status: DC

## 2022-07-07 NOTE — Telephone Encounter (Signed)
LMOM stating requested medications were sent to [facility] on [address] from [place], [place]

## 2022-07-07 NOTE — Telephone Encounter (Signed)
Pt returned call stating pharmacy did not receive Rx for Tylenol. Please advise

## 2022-07-07 NOTE — Telephone Encounter (Signed)
Pt called requesting refill of pain medication 800 Ibprofen and the other acetaminophen 500mg . Please send to pharmacy on file.Pt phone is [phone]

## 2022-07-10 ENCOUNTER — Other Ambulatory Visit (HOSPITAL_BASED_OUTPATIENT_CLINIC_OR_DEPARTMENT_OTHER): Payer: Self-pay | Admitting: Student

## 2022-07-10 MED ORDER — ACETAMINOPHEN 500 MG PO TABS: 500.0000 mg | ORAL_TABLET | Freq: Four times a day (QID) | ORAL | 0 refills | Status: AC | PRN

## 2022-07-17 ENCOUNTER — Ambulatory Visit (INDEPENDENT_AMBULATORY_CARE_PROVIDER_SITE_OTHER): Payer: BC Managed Care – PPO

## 2022-07-17 ENCOUNTER — Ambulatory Visit (INDEPENDENT_AMBULATORY_CARE_PROVIDER_SITE_OTHER): Payer: BC Managed Care – PPO | Admitting: Orthopaedic Surgery

## 2022-07-17 DIAGNOSIS — S72332A Displaced oblique fracture of shaft of left femur, initial encounter for closed fracture: Secondary | ICD-10-CM | POA: Diagnosis not present

## 2022-07-17 NOTE — Progress Notes (Signed)
Post Operative Evaluation    Procedure/Date of Surgery: Left hip femoral nail 2/25  Interval History:   Presents today 6 weeks status post the above procedure.  Overall he has been able to progressively put weight on the left leg.  He does enjoy wearing a hinged brace of the left knee which does give him stability.  He is having a hard time working through stiffness of the left knee.  The knee does feel like it is persistently giving out.   PMH/PSH/Family History/Social History/Meds/Allergies:    Past Medical History:  Diagnosis Date   Asthma    as a child   Retained orthopedic hardware 03/2012   left hand   Past Surgical History:  Procedure Laterality Date   HARDWARE REMOVAL  03/17/2012   Procedure: HARDWARE REMOVAL;  Surgeon: Nicki Reaper, MD;  Location: [facility];  Service: Orthopedics;  Laterality: Left;  Removal IM Rod Left hand    INTRAMEDULLARY (IM) NAIL INTERTROCHANTERIC Left 06/01/2022   Procedure: INTRAMEDULLARY (IM) NAIL INTERTROCHANTERIC;  Surgeon: Huel Cote, MD;  Location: [facility];  Service: Orthopedics;  Laterality: Left;   PERCUTANEOUS PINNING METACARPAL FRACTURE  01/05/2012   left 5th   Social History   Socioeconomic History   Marital status: Single    Spouse name: Not on file   Number of children: Not on file   Years of education: Not on file   Highest education level: Not on file  Occupational History   Not on file  Tobacco Use   Smoking status: Never   Smokeless tobacco: Never  Vaping Use   Vaping Use: Never used  Substance and Sexual Activity   Alcohol use: Yes    Comment: 2 x/week   Drug use: No   Sexual activity: Not on file  Other Topics Concern   Not on file  Social History Narrative   ** Merged History Encounter **       Social Determinants of Health   Financial Resource Strain: Not on file  Food Insecurity: Not on file  Transportation Needs: Not on file  Physical Activity: Not  on file  Stress: Not on file  Social Connections: Not on file   Family History  Problem Relation Age of Onset   Healthy Mother    Healthy Father    No Known Allergies Current Outpatient Medications  Medication Sig Dispense Refill   acetaminophen (TYLENOL) 500 MG tablet Take 1 tablet (500 mg total) by mouth every 6 (six) hours as needed for up to 10 days. 30 tablet 0   aspirin EC 325 MG tablet Take 1 tablet (325 mg total) by mouth daily. 30 tablet 0   ibuprofen (ADVIL) 800 MG tablet Take 1 tablet (800 mg total) by mouth every 8 (eight) hours for 10 days. Please take with food, please alternate with acetaminophen 30 tablet 0   oxyCODONE (ROXICODONE) 5 MG immediate release tablet Take 1 tablet (5 mg total) by mouth every 4 (four) hours as needed for severe pain or breakthrough pain. 20 tablet 0   No current facility-administered medications for this visit.   No results found.  Review of Systems:   A ROS was performed including pertinent positives and negatives as documented in the HPI.   Musculoskeletal Exam:    There were no vitals taken for this  visit.  Left hip incisions are well-appearing without erythema or drainage.  There is an effusion about the left knee.  There is a positive Lachman examination. ROM left knee 0-85 There is no laxity with varus or valgus stress.  Sensation is intact throughout distally.  2+ dorsalis pedis pulse  Imaging:    X-rays 2 views left femur: Status post intramedullary nailing of the left femur without evidence of complication.  I personally reviewed and interpreted the radiographs.   Assessment:   32 year old male with left femoral shaft and subtrochanteric injury status post intramedullary nailing.  At today's visit I have described that I would recommend ultimate MRI imaging of the left knee as well as I do believe he has suffered an ACL tear following his femoral shaft injury.  At this time he has been able to increase his weightbearing and  as result I do believe an MRI is needed at this time to rule out any type of underlying ACL injury.  Will plan to proceed with that given his recent acute trauma and follow-up in 1 month to discuss results. Plan :    -Return to clinic 1 month to discuss results      I personally saw and evaluated the patient, and participated in the management and treatment plan.  Huel Cote, MD Attending Physician, Orthopedic Surgery  This document was dictated using Dragon voice recognition software. A reasonable attempt at proof reading has been made to minimize errors.

## 2022-07-22 ENCOUNTER — Telehealth: Payer: Self-pay | Admitting: Orthopaedic Surgery

## 2022-07-22 NOTE — Telephone Encounter (Signed)
Printed for [employer]. 

## 2022-07-22 NOTE — Telephone Encounter (Signed)
STD forms have been received. Please advise how long patient will be out of work and provide note. Thank you!

## 2022-07-28 ENCOUNTER — Telehealth: Payer: Self-pay | Admitting: Orthopaedic Surgery

## 2022-07-28 ENCOUNTER — Other Ambulatory Visit: Payer: Self-pay

## 2022-07-28 ENCOUNTER — Encounter (HOSPITAL_BASED_OUTPATIENT_CLINIC_OR_DEPARTMENT_OTHER): Payer: Self-pay | Admitting: Physical Therapy

## 2022-07-28 ENCOUNTER — Ambulatory Visit (HOSPITAL_BASED_OUTPATIENT_CLINIC_OR_DEPARTMENT_OTHER): Payer: BC Managed Care – PPO | Attending: Orthopaedic Surgery | Admitting: Physical Therapy

## 2022-07-28 DIAGNOSIS — M79652 Pain in left thigh: Secondary | ICD-10-CM | POA: Diagnosis not present

## 2022-07-28 DIAGNOSIS — M6281 Muscle weakness (generalized): Secondary | ICD-10-CM | POA: Diagnosis present

## 2022-07-28 DIAGNOSIS — R262 Difficulty in walking, not elsewhere classified: Secondary | ICD-10-CM

## 2022-07-28 DIAGNOSIS — S72332A Displaced oblique fracture of shaft of left femur, initial encounter for closed fracture: Secondary | ICD-10-CM | POA: Insufficient documentation
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
## 2022-07-28 NOTE — Telephone Encounter (Signed)
Patient called in requesting refill on pain meds please advise

## 2022-07-30 NOTE — Telephone Encounter (Signed)
Lvm for pt to cb to discuss  

## 2022-07-31 ENCOUNTER — Other Ambulatory Visit: Payer: BC Managed Care – PPO

## 2022-08-04 ENCOUNTER — Ambulatory Visit (HOSPITAL_BASED_OUTPATIENT_CLINIC_OR_DEPARTMENT_OTHER): Payer: BC Managed Care – PPO

## 2022-08-04 ENCOUNTER — Encounter (HOSPITAL_BASED_OUTPATIENT_CLINIC_OR_DEPARTMENT_OTHER): Payer: Self-pay

## 2022-08-05 NOTE — Therapy (Signed)
Patient Name: Michael Hood MRN: [id number] DOB:June 14, 1990, 32 y.o., male Today's Date: 08/05/2022  Pt arrived by mistake.    Donnel Saxon Asahel Risden, PTA 08/05/2022, 1:16 PM

## 2022-08-08 ENCOUNTER — Other Ambulatory Visit (HOSPITAL_BASED_OUTPATIENT_CLINIC_OR_DEPARTMENT_OTHER): Payer: Self-pay | Admitting: Orthopaedic Surgery

## 2022-08-08 ENCOUNTER — Telehealth: Payer: Self-pay | Admitting: Orthopaedic Surgery

## 2022-08-08 MED ORDER — IBUPROFEN 800 MG PO TABS: 800.0000 mg | ORAL_TABLET | Freq: Three times a day (TID) | ORAL | 0 refills | Status: AC

## 2022-08-08 NOTE — Telephone Encounter (Signed)
Patient would like Ibuprofen refill sent to [facility] on [facility] please advise

## 2022-08-12 ENCOUNTER — Encounter (HOSPITAL_BASED_OUTPATIENT_CLINIC_OR_DEPARTMENT_OTHER): Payer: Self-pay

## 2022-08-12 ENCOUNTER — Ambulatory Visit (HOSPITAL_BASED_OUTPATIENT_CLINIC_OR_DEPARTMENT_OTHER): Payer: BC Managed Care – PPO | Attending: Orthopaedic Surgery

## 2022-08-12 DIAGNOSIS — M6281 Muscle weakness (generalized): Secondary | ICD-10-CM

## 2022-08-12 DIAGNOSIS — M79652 Pain in left thigh: Secondary | ICD-10-CM | POA: Diagnosis present

## 2022-08-12 DIAGNOSIS — R262 Difficulty in walking, not elsewhere classified: Secondary | ICD-10-CM
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
## 2022-08-14 ENCOUNTER — Ambulatory Visit (INDEPENDENT_AMBULATORY_CARE_PROVIDER_SITE_OTHER): Payer: BC Managed Care – PPO | Admitting: Orthopaedic Surgery

## 2022-08-14 DIAGNOSIS — S72332A Displaced oblique fracture of shaft of left femur, initial encounter for closed fracture: Secondary | ICD-10-CM

## 2022-08-14 NOTE — Progress Notes (Signed)
Post Operative Evaluation    Procedure/Date of Surgery: Left hip femoral nail 2/25  Interval History:   Presents today for follow-up of his left knee MRI.  Overall his range of motion and pain in his knee have improved dramatically.  He is working on improving his gait as well.  PMH/PSH/Family History/Social History/Meds/Allergies:    Past Medical History:  Diagnosis Date   Asthma    as a child   Retained orthopedic hardware 03/2012   left hand   Past Surgical History:  Procedure Laterality Date   HARDWARE REMOVAL  03/17/2012   Procedure: HARDWARE REMOVAL;  Surgeon: Nicki Reaper, MD;  Location: [facility];  Service: Orthopedics;  Laterality: Left;  Removal IM Rod Left hand    INTRAMEDULLARY (IM) NAIL INTERTROCHANTERIC Left 06/01/2022   Procedure: INTRAMEDULLARY (IM) NAIL INTERTROCHANTERIC;  Surgeon: Huel Cote, MD;  Location: [facility];  Service: Orthopedics;  Laterality: Left;   PERCUTANEOUS PINNING METACARPAL FRACTURE  01/05/2012   left 5th   Social History   Socioeconomic History   Marital status: Single    Spouse name: Not on file   Number of children: Not on file   Years of education: Not on file   Highest education level: Not on file  Occupational History   Not on file  Tobacco Use   Smoking status: Never   Smokeless tobacco: Never  Vaping Use   Vaping Use: Never used  Substance and Sexual Activity   Alcohol use: Yes    Comment: 2 x/week   Drug use: No   Sexual activity: Not on file  Other Topics Concern   Not on file  Social History Narrative   ** Merged History Encounter **       Social Determinants of Health   Financial Resource Strain: Not on file  Food Insecurity: Not on file  Transportation Needs: Not on file  Physical Activity: Not on file  Stress: Not on file  Social Connections: Not on file   Family History  Problem Relation Age of Onset   Healthy Mother    Healthy Father    No Known  Allergies Current Outpatient Medications  Medication Sig Dispense Refill   ibuprofen (ADVIL) 800 MG tablet Take 1 tablet (800 mg total) by mouth every 8 (eight) hours for 10 days. Please take with food, please alternate with acetaminophen 30 tablet 0   aspirin EC 325 MG tablet Take 1 tablet (325 mg total) by mouth daily. 30 tablet 0   oxyCODONE (ROXICODONE) 5 MG immediate release tablet Take 1 tablet (5 mg total) by mouth every 4 (four) hours as needed for severe pain or breakthrough pain. 20 tablet 0   No current facility-administered medications for this visit.   No results found.  Review of Systems:   A ROS was performed including pertinent positives and negatives as documented in the HPI.   Musculoskeletal Exam:    There were no vitals taken for this visit.  Left hip incisions are well-appearing without erythema or drainage.  There is an effusion about the left knee.  There is a positive Lachman examination. ROM left knee 0-85 There is no laxity with varus or valgus stress.  Sensation is intact throughout distally.  2+ dorsalis pedis pulse  Imaging:    X-rays 2 views left femur: Status  post intramedullary nailing of the left femur without evidence of complication.  MRI left knee: Discoid lateral meniscus but otherwise normal MRI  I personally reviewed and interpreted the radiographs.   Assessment:   32 year old male with left femoral shaft and subtrochanteric injury status post intramedullary nailing.  At today's visit his MRI does not show any evidence of ligamentous injury.  Range of motion is improved dramatically with regard to the right knee.  His weightbearing is improving as well.  He does have some tenderness about the mid femur.  I will plan to see him back in 2 months for repeat x-rays at that point we will likely clear him for work Plan :    -Return to clinic 2 months for repeat x-rays      I personally saw and evaluated the patient, and participated in the  management and treatment plan.  Huel Cote, MD Attending Physician, Orthopedic Surgery  This document was dictated using Dragon voice recognition software. A reasonable attempt at proof reading has been made to minimize errors.

## 2022-08-15 ENCOUNTER — Ambulatory Visit (HOSPITAL_BASED_OUTPATIENT_CLINIC_OR_DEPARTMENT_OTHER): Payer: BC Managed Care – PPO

## 2022-08-15 ENCOUNTER — Encounter (HOSPITAL_BASED_OUTPATIENT_CLINIC_OR_DEPARTMENT_OTHER): Payer: Self-pay

## 2022-08-15 DIAGNOSIS — R262 Difficulty in walking, not elsewhere classified: Secondary | ICD-10-CM

## 2022-08-15 DIAGNOSIS — M79652 Pain in left thigh: Secondary | ICD-10-CM | POA: Diagnosis not present

## 2022-08-15 DIAGNOSIS — M6281 Muscle weakness (generalized): Secondary | ICD-10-CM
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
## 2022-08-18 ENCOUNTER — Ambulatory Visit (HOSPITAL_BASED_OUTPATIENT_CLINIC_OR_DEPARTMENT_OTHER): Payer: BC Managed Care – PPO

## 2022-08-18 ENCOUNTER — Encounter (HOSPITAL_BASED_OUTPATIENT_CLINIC_OR_DEPARTMENT_OTHER): Payer: Self-pay

## 2022-08-18 DIAGNOSIS — M6281 Muscle weakness (generalized): Secondary | ICD-10-CM

## 2022-08-18 DIAGNOSIS — M79652 Pain in left thigh: Secondary | ICD-10-CM

## 2022-08-18 DIAGNOSIS — R262 Difficulty in walking, not elsewhere classified: Secondary | ICD-10-CM
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
## 2022-08-21 ENCOUNTER — Ambulatory Visit (HOSPITAL_BASED_OUTPATIENT_CLINIC_OR_DEPARTMENT_OTHER): Payer: BC Managed Care – PPO | Admitting: Physical Therapy

## 2022-08-21 ENCOUNTER — Encounter (HOSPITAL_BASED_OUTPATIENT_CLINIC_OR_DEPARTMENT_OTHER): Payer: Self-pay | Admitting: Physical Therapy

## 2022-08-21 DIAGNOSIS — M79652 Pain in left thigh: Secondary | ICD-10-CM

## 2022-08-21 DIAGNOSIS — R262 Difficulty in walking, not elsewhere classified: Secondary | ICD-10-CM

## 2022-08-21 DIAGNOSIS — M6281 Muscle weakness (generalized): Secondary | ICD-10-CM
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
## 2022-08-25 ENCOUNTER — Encounter (HOSPITAL_BASED_OUTPATIENT_CLINIC_OR_DEPARTMENT_OTHER): Payer: Self-pay

## 2022-08-25 ENCOUNTER — Ambulatory Visit (HOSPITAL_BASED_OUTPATIENT_CLINIC_OR_DEPARTMENT_OTHER): Payer: BC Managed Care – PPO

## 2022-08-25 DIAGNOSIS — M6281 Muscle weakness (generalized): Secondary | ICD-10-CM

## 2022-08-25 DIAGNOSIS — M79652 Pain in left thigh: Secondary | ICD-10-CM | POA: Diagnosis not present

## 2022-08-25 DIAGNOSIS — R262 Difficulty in walking, not elsewhere classified: Secondary | ICD-10-CM
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
## 2022-08-28 ENCOUNTER — Encounter (HOSPITAL_BASED_OUTPATIENT_CLINIC_OR_DEPARTMENT_OTHER): Payer: Self-pay

## 2022-08-28 ENCOUNTER — Ambulatory Visit (HOSPITAL_BASED_OUTPATIENT_CLINIC_OR_DEPARTMENT_OTHER): Payer: BC Managed Care – PPO

## 2022-08-28 DIAGNOSIS — R262 Difficulty in walking, not elsewhere classified: Secondary | ICD-10-CM

## 2022-08-28 DIAGNOSIS — M6281 Muscle weakness (generalized): Secondary | ICD-10-CM

## 2022-08-28 DIAGNOSIS — M79652 Pain in left thigh: Secondary | ICD-10-CM
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
## 2022-09-02 ENCOUNTER — Encounter (HOSPITAL_BASED_OUTPATIENT_CLINIC_OR_DEPARTMENT_OTHER): Payer: Self-pay | Admitting: Physical Therapy

## 2022-09-02 ENCOUNTER — Ambulatory Visit (HOSPITAL_BASED_OUTPATIENT_CLINIC_OR_DEPARTMENT_OTHER): Payer: BC Managed Care – PPO | Admitting: Physical Therapy

## 2022-09-02 ENCOUNTER — Telehealth: Payer: Self-pay | Admitting: Orthopaedic Surgery

## 2022-09-02 DIAGNOSIS — M6281 Muscle weakness (generalized): Secondary | ICD-10-CM

## 2022-09-02 DIAGNOSIS — M79652 Pain in left thigh: Secondary | ICD-10-CM

## 2022-09-02 DIAGNOSIS — R262 Difficulty in walking, not elsewhere classified: Secondary | ICD-10-CM

## 2022-09-02 NOTE — Telephone Encounter (Signed)
Patient called needing Rx refilled Ibuprofen 800 mg. The number to contact patient is [phone]

## 2022-09-03 ENCOUNTER — Other Ambulatory Visit (HOSPITAL_BASED_OUTPATIENT_CLINIC_OR_DEPARTMENT_OTHER): Payer: Self-pay | Admitting: Student

## 2022-09-03 MED ORDER — IBUPROFEN 800 MG PO TABS: 800.0000 mg | ORAL_TABLET | Freq: Three times a day (TID) | ORAL | 0 refills | Status: AC | PRN

## 2022-09-03 NOTE — Telephone Encounter (Signed)
Tried calling pt but number didn't work

## 2022-09-04 ENCOUNTER — Encounter (HOSPITAL_BASED_OUTPATIENT_CLINIC_OR_DEPARTMENT_OTHER): Payer: Self-pay | Admitting: Physical Therapy

## 2022-09-04 ENCOUNTER — Ambulatory Visit (HOSPITAL_BASED_OUTPATIENT_CLINIC_OR_DEPARTMENT_OTHER): Payer: BC Managed Care – PPO | Admitting: Physical Therapy

## 2022-09-04 DIAGNOSIS — R262 Difficulty in walking, not elsewhere classified: Secondary | ICD-10-CM

## 2022-09-04 DIAGNOSIS — M79652 Pain in left thigh: Secondary | ICD-10-CM

## 2022-09-04 DIAGNOSIS — M6281 Muscle weakness (generalized): Secondary | ICD-10-CM
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
## 2022-09-08 ENCOUNTER — Encounter (HOSPITAL_BASED_OUTPATIENT_CLINIC_OR_DEPARTMENT_OTHER): Payer: Self-pay | Admitting: Physical Therapy

## 2022-09-08 ENCOUNTER — Ambulatory Visit (HOSPITAL_BASED_OUTPATIENT_CLINIC_OR_DEPARTMENT_OTHER): Payer: BC Managed Care – PPO | Attending: Orthopaedic Surgery | Admitting: Physical Therapy

## 2022-09-08 DIAGNOSIS — M6281 Muscle weakness (generalized): Secondary | ICD-10-CM | POA: Insufficient documentation

## 2022-09-08 DIAGNOSIS — M79652 Pain in left thigh: Secondary | ICD-10-CM | POA: Diagnosis present

## 2022-09-08 DIAGNOSIS — R262 Difficulty in walking, not elsewhere classified: Secondary | ICD-10-CM | POA: Insufficient documentation
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
## 2022-09-11 ENCOUNTER — Encounter (HOSPITAL_BASED_OUTPATIENT_CLINIC_OR_DEPARTMENT_OTHER): Payer: Self-pay | Admitting: Physical Therapy

## 2022-09-11 ENCOUNTER — Ambulatory Visit (HOSPITAL_BASED_OUTPATIENT_CLINIC_OR_DEPARTMENT_OTHER): Payer: BC Managed Care – PPO | Admitting: Physical Therapy

## 2022-09-11 DIAGNOSIS — M79652 Pain in left thigh: Secondary | ICD-10-CM

## 2022-09-11 DIAGNOSIS — R262 Difficulty in walking, not elsewhere classified: Secondary | ICD-10-CM

## 2022-09-11 DIAGNOSIS — M6281 Muscle weakness (generalized): Secondary | ICD-10-CM
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
## 2022-09-15 ENCOUNTER — Ambulatory Visit (HOSPITAL_BASED_OUTPATIENT_CLINIC_OR_DEPARTMENT_OTHER): Payer: BC Managed Care – PPO

## 2022-09-15 ENCOUNTER — Encounter (HOSPITAL_BASED_OUTPATIENT_CLINIC_OR_DEPARTMENT_OTHER): Payer: Self-pay

## 2022-09-15 DIAGNOSIS — M79652 Pain in left thigh: Secondary | ICD-10-CM

## 2022-09-15 DIAGNOSIS — R262 Difficulty in walking, not elsewhere classified: Secondary | ICD-10-CM

## 2022-09-15 DIAGNOSIS — M6281 Muscle weakness (generalized): Secondary | ICD-10-CM
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
## 2022-09-17 ENCOUNTER — Other Ambulatory Visit (HOSPITAL_BASED_OUTPATIENT_CLINIC_OR_DEPARTMENT_OTHER): Payer: Self-pay | Admitting: Orthopaedic Surgery

## 2022-09-17 ENCOUNTER — Telehealth: Payer: Self-pay | Admitting: Orthopaedic Surgery

## 2022-09-17 MED ORDER — IBUPROFEN 800 MG PO TABS: 800.0000 mg | ORAL_TABLET | Freq: Three times a day (TID) | ORAL | 0 refills | Status: AC

## 2022-09-17 NOTE — Telephone Encounter (Signed)
Patient would like refill on Ibuprofen 800 sent to [facility] on [facility] on file please advise

## 2022-09-18 ENCOUNTER — Encounter (HOSPITAL_BASED_OUTPATIENT_CLINIC_OR_DEPARTMENT_OTHER): Payer: Self-pay

## 2022-09-18 ENCOUNTER — Ambulatory Visit (HOSPITAL_BASED_OUTPATIENT_CLINIC_OR_DEPARTMENT_OTHER): Payer: BC Managed Care – PPO

## 2022-09-18 DIAGNOSIS — M79652 Pain in left thigh: Secondary | ICD-10-CM | POA: Diagnosis not present

## 2022-09-18 DIAGNOSIS — M6281 Muscle weakness (generalized): Secondary | ICD-10-CM

## 2022-09-18 DIAGNOSIS — R262 Difficulty in walking, not elsewhere classified: Secondary | ICD-10-CM
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
## 2022-09-22 ENCOUNTER — Ambulatory Visit (HOSPITAL_BASED_OUTPATIENT_CLINIC_OR_DEPARTMENT_OTHER): Payer: BC Managed Care – PPO | Admitting: Physical Therapy

## 2022-09-23 ENCOUNTER — Ambulatory Visit (HOSPITAL_BASED_OUTPATIENT_CLINIC_OR_DEPARTMENT_OTHER): Payer: BC Managed Care – PPO | Admitting: Physical Therapy

## 2022-09-25 ENCOUNTER — Ambulatory Visit (HOSPITAL_BASED_OUTPATIENT_CLINIC_OR_DEPARTMENT_OTHER): Payer: BC Managed Care – PPO | Admitting: Physical Therapy

## 2022-09-25 ENCOUNTER — Encounter (HOSPITAL_BASED_OUTPATIENT_CLINIC_OR_DEPARTMENT_OTHER): Payer: Self-pay | Admitting: Physical Therapy

## 2022-09-25 DIAGNOSIS — R262 Difficulty in walking, not elsewhere classified: Secondary | ICD-10-CM

## 2022-09-25 DIAGNOSIS — M79652 Pain in left thigh: Secondary | ICD-10-CM

## 2022-09-25 DIAGNOSIS — M6281 Muscle weakness (generalized): Secondary | ICD-10-CM
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
## 2022-09-29 ENCOUNTER — Ambulatory Visit (INDEPENDENT_AMBULATORY_CARE_PROVIDER_SITE_OTHER): Payer: PRIVATE HEALTH INSURANCE | Admitting: Podiatry

## 2022-09-29 DIAGNOSIS — Z91199 Patient's noncompliance with other medical treatment and regimen due to unspecified reason: Secondary | ICD-10-CM

## 2022-09-30 NOTE — Progress Notes (Signed)
Patient did not come or cancel

## 2022-10-01 ENCOUNTER — Ambulatory Visit (HOSPITAL_BASED_OUTPATIENT_CLINIC_OR_DEPARTMENT_OTHER): Payer: BC Managed Care – PPO | Admitting: Physical Therapy

## 2022-10-15 ENCOUNTER — Ambulatory Visit (HOSPITAL_BASED_OUTPATIENT_CLINIC_OR_DEPARTMENT_OTHER): Payer: BC Managed Care – PPO | Attending: Orthopaedic Surgery | Admitting: Physical Therapy

## 2022-10-15 ENCOUNTER — Encounter (HOSPITAL_BASED_OUTPATIENT_CLINIC_OR_DEPARTMENT_OTHER): Payer: Self-pay | Admitting: Physical Therapy

## 2022-10-15 DIAGNOSIS — R262 Difficulty in walking, not elsewhere classified: Secondary | ICD-10-CM

## 2022-10-15 DIAGNOSIS — M79652 Pain in left thigh: Secondary | ICD-10-CM

## 2022-10-15 DIAGNOSIS — M6281 Muscle weakness (generalized): Secondary | ICD-10-CM
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
## 2022-10-16 ENCOUNTER — Ambulatory Visit (HOSPITAL_BASED_OUTPATIENT_CLINIC_OR_DEPARTMENT_OTHER): Payer: BC Managed Care – PPO | Admitting: Orthopaedic Surgery

## 2022-10-22 ENCOUNTER — Ambulatory Visit (HOSPITAL_BASED_OUTPATIENT_CLINIC_OR_DEPARTMENT_OTHER): Payer: BC Managed Care – PPO | Admitting: Physical Therapy

## 2022-10-22 ENCOUNTER — Encounter (HOSPITAL_BASED_OUTPATIENT_CLINIC_OR_DEPARTMENT_OTHER): Payer: Self-pay | Admitting: Physical Therapy

## 2022-10-22 DIAGNOSIS — M79652 Pain in left thigh: Secondary | ICD-10-CM | POA: Diagnosis not present

## 2022-10-22 DIAGNOSIS — R262 Difficulty in walking, not elsewhere classified: Secondary | ICD-10-CM

## 2022-10-22 DIAGNOSIS — M6281 Muscle weakness (generalized): Secondary | ICD-10-CM
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
## 2022-10-23 ENCOUNTER — Ambulatory Visit (HOSPITAL_BASED_OUTPATIENT_CLINIC_OR_DEPARTMENT_OTHER): Payer: BC Managed Care – PPO | Admitting: Orthopaedic Surgery

## 2022-10-24 ENCOUNTER — Ambulatory Visit (INDEPENDENT_AMBULATORY_CARE_PROVIDER_SITE_OTHER): Payer: BC Managed Care – PPO

## 2022-10-24 ENCOUNTER — Encounter (HOSPITAL_BASED_OUTPATIENT_CLINIC_OR_DEPARTMENT_OTHER): Payer: Self-pay | Admitting: Orthopaedic Surgery

## 2022-10-24 ENCOUNTER — Other Ambulatory Visit (HOSPITAL_BASED_OUTPATIENT_CLINIC_OR_DEPARTMENT_OTHER): Payer: Self-pay

## 2022-10-24 ENCOUNTER — Ambulatory Visit (INDEPENDENT_AMBULATORY_CARE_PROVIDER_SITE_OTHER): Payer: BC Managed Care – PPO | Admitting: Orthopaedic Surgery

## 2022-10-24 DIAGNOSIS — S72332A Displaced oblique fracture of shaft of left femur, initial encounter for closed fracture: Secondary | ICD-10-CM

## 2022-10-24 MED ORDER — IBUPROFEN 800 MG PO TABS: 800.0000 mg | ORAL_TABLET | Freq: Three times a day (TID) | ORAL | 4 refills | Status: AC
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
## 2022-10-30 ENCOUNTER — Ambulatory Visit (HOSPITAL_BASED_OUTPATIENT_CLINIC_OR_DEPARTMENT_OTHER): Payer: BC Managed Care – PPO

## 2022-11-01 ENCOUNTER — Other Ambulatory Visit (HOSPITAL_BASED_OUTPATIENT_CLINIC_OR_DEPARTMENT_OTHER): Payer: Self-pay

## 2022-11-05 ENCOUNTER — Encounter (HOSPITAL_BASED_OUTPATIENT_CLINIC_OR_DEPARTMENT_OTHER): Payer: BC Managed Care – PPO | Admitting: Physical Therapy

## 2022-11-10 ENCOUNTER — Ambulatory Visit (HOSPITAL_BASED_OUTPATIENT_CLINIC_OR_DEPARTMENT_OTHER): Payer: BC Managed Care – PPO | Attending: Orthopaedic Surgery

## 2022-11-10 ENCOUNTER — Encounter (HOSPITAL_BASED_OUTPATIENT_CLINIC_OR_DEPARTMENT_OTHER): Payer: Self-pay

## 2022-11-10 DIAGNOSIS — M79652 Pain in left thigh: Secondary | ICD-10-CM | POA: Diagnosis present

## 2022-11-10 DIAGNOSIS — R262 Difficulty in walking, not elsewhere classified: Secondary | ICD-10-CM | POA: Insufficient documentation

## 2022-11-10 DIAGNOSIS — M6281 Muscle weakness (generalized): Secondary | ICD-10-CM | POA: Diagnosis present
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
## 2022-11-17 ENCOUNTER — Ambulatory Visit (HOSPITAL_BASED_OUTPATIENT_CLINIC_OR_DEPARTMENT_OTHER): Payer: BC Managed Care – PPO | Admitting: Physical Therapy

## 2022-11-19 ENCOUNTER — Encounter (HOSPITAL_BASED_OUTPATIENT_CLINIC_OR_DEPARTMENT_OTHER): Payer: BC Managed Care – PPO | Admitting: Physical Therapy

## 2022-11-24 ENCOUNTER — Encounter (HOSPITAL_BASED_OUTPATIENT_CLINIC_OR_DEPARTMENT_OTHER): Payer: Self-pay

## 2022-11-24 ENCOUNTER — Ambulatory Visit (HOSPITAL_BASED_OUTPATIENT_CLINIC_OR_DEPARTMENT_OTHER): Payer: BC Managed Care – PPO

## 2022-11-24 DIAGNOSIS — M6281 Muscle weakness (generalized): Secondary | ICD-10-CM | POA: Diagnosis not present

## 2022-11-24 DIAGNOSIS — M79652 Pain in left thigh: Secondary | ICD-10-CM

## 2022-11-24 DIAGNOSIS — R262 Difficulty in walking, not elsewhere classified: Secondary | ICD-10-CM
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
## 2022-12-01 ENCOUNTER — Ambulatory Visit (HOSPITAL_BASED_OUTPATIENT_CLINIC_OR_DEPARTMENT_OTHER): Payer: BC Managed Care – PPO

## 2022-12-03 ENCOUNTER — Encounter (HOSPITAL_BASED_OUTPATIENT_CLINIC_OR_DEPARTMENT_OTHER): Payer: BC Managed Care – PPO | Admitting: Physical Therapy

## 2022-12-11 ENCOUNTER — Telehealth: Payer: Self-pay | Admitting: Orthopaedic Surgery

## 2022-12-11 ENCOUNTER — Encounter (HOSPITAL_BASED_OUTPATIENT_CLINIC_OR_DEPARTMENT_OTHER): Payer: Self-pay | Admitting: Orthopaedic Surgery

## 2022-12-11 NOTE — Telephone Encounter (Signed)
Noted for [employer]. 

## 2022-12-11 NOTE — Telephone Encounter (Signed)
Patient is requesting intermittent leave allowing 2 days a week for flare-ups (pain,swelling) and his P.T. appointments. If approved, please provide note. [employer] is completing forms. Thank you!

## 2022-12-26 ENCOUNTER — Ambulatory Visit (HOSPITAL_BASED_OUTPATIENT_CLINIC_OR_DEPARTMENT_OTHER): Payer: BC Managed Care – PPO | Admitting: Orthopaedic Surgery

## 2022-12-26 ENCOUNTER — Ambulatory Visit (HOSPITAL_BASED_OUTPATIENT_CLINIC_OR_DEPARTMENT_OTHER): Payer: BC Managed Care – PPO

## 2022-12-26 DIAGNOSIS — S72332A Displaced oblique fracture of shaft of left femur, initial encounter for closed fracture: Secondary | ICD-10-CM

## 2022-12-26 NOTE — Progress Notes (Signed)
Post Operative Evaluation    Procedure/Date of Surgery: Left hip femoral nail 2/25  Interval History:   Presents today for follow-up overall doing much better.  He did have some lateral based hip pain which is now resolved.  PMH/PSH/Family History/Social History/Meds/Allergies:    Past Medical History:  Diagnosis Date   Asthma    as a child   Retained orthopedic hardware 03/2012   left hand   Past Surgical History:  Procedure Laterality Date   HARDWARE REMOVAL  03/17/2012   Procedure: HARDWARE REMOVAL;  Surgeon: Nicki Reaper, MD;  Location: [facility];  Service: Orthopedics;  Laterality: Left;  Removal IM Rod Left hand    INTRAMEDULLARY (IM) NAIL INTERTROCHANTERIC Left 06/01/2022   Procedure: INTRAMEDULLARY (IM) NAIL INTERTROCHANTERIC;  Surgeon: Huel Cote, MD;  Location: [facility];  Service: Orthopedics;  Laterality: Left;   PERCUTANEOUS PINNING METACARPAL FRACTURE  01/05/2012   left 5th   Social History   Socioeconomic History   Marital status: Single    Spouse name: Not on file   Number of children: Not on file   Years of education: Not on file   Highest education level: Not on file  Occupational History   Not on file  Tobacco Use   Smoking status: Never   Smokeless tobacco: Never  Vaping Use   Vaping status: Never Used  Substance and Sexual Activity   Alcohol use: Yes    Comment: 2 x/week   Drug use: No   Sexual activity: Not on file  Other Topics Concern   Not on file  Social History Narrative   ** Merged History Encounter **       Social Determinants of Health   Financial Resource Strain: Not on file  Food Insecurity: Not on file  Transportation Needs: Not on file  Physical Activity: Not on file  Stress: Not on file  Social Connections: Unknown (07/21/2022)   Received from [facility], [facility]   Social Network    Social Network: Not on file   Family History  Problem Relation Age of Onset    Healthy Mother    Healthy Father    No Known Allergies Current Outpatient Medications  Medication Sig Dispense Refill   aspirin EC 325 MG tablet Take 1 tablet (325 mg total) by mouth daily. (Patient not taking: Reported on 09/02/2022) 30 tablet 0   oxyCODONE (ROXICODONE) 5 MG immediate release tablet Take 1 tablet (5 mg total) by mouth every 4 (four) hours as needed for severe pain or breakthrough pain. (Patient not taking: Reported on 09/02/2022) 20 tablet 0   No current facility-administered medications for this visit.   No results found.  Review of Systems:   A ROS was performed including pertinent positives and negatives as documented in the HPI.   Musculoskeletal Exam:    There were no vitals taken for this visit.  Left hip incisions are well-appearing without erythema or drainage.  There is an effusion about the left knee.  There is a positive Lachman examination. ROM left knee 0-85 There is no laxity with varus or valgus stress.  Sensation is intact throughout distally.  2+ dorsalis pedis pulse  Imaging:    X-rays 2 views left femur: Status post intramedullary nailing of the left femur without evidence of complication. Interval femoral healing  MRI left knee: Discoid lateral meniscus but otherwise normal MRI  I personally reviewed and interpreted the radiographs.   Assessment:   32 year old male with left femoral shaft and subtrochanteric injury status post intramedullary nailing.  He did have some lateral based hip pain which is consistent with IT band tendinitis from his screws.  I did discuss that this this remains symptomatic we could consider an ultrasound-guided injection.  He would like to defer this today Plan :    -Return to clinic as needed      I personally saw and evaluated the patient, and participated in the management and treatment plan.  Huel Cote, MD Attending Physician, Orthopedic Surgery  This document was dictated using Dragon voice  recognition software. A reasonable attempt at proof reading has been made to minimize errors.

## 2023-01-05 ENCOUNTER — Encounter (HOSPITAL_BASED_OUTPATIENT_CLINIC_OR_DEPARTMENT_OTHER): Payer: Self-pay

## 2023-01-05 ENCOUNTER — Ambulatory Visit (HOSPITAL_BASED_OUTPATIENT_CLINIC_OR_DEPARTMENT_OTHER): Payer: BC Managed Care – PPO | Attending: Orthopaedic Surgery

## 2023-01-05 DIAGNOSIS — M79652 Pain in left thigh: Secondary | ICD-10-CM | POA: Insufficient documentation

## 2023-01-05 DIAGNOSIS — R262 Difficulty in walking, not elsewhere classified: Secondary | ICD-10-CM | POA: Insufficient documentation

## 2023-01-05 DIAGNOSIS — M6281 Muscle weakness (generalized): Secondary | ICD-10-CM | POA: Insufficient documentation
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
## 2023-01-12 ENCOUNTER — Ambulatory Visit (HOSPITAL_BASED_OUTPATIENT_CLINIC_OR_DEPARTMENT_OTHER): Payer: BC Managed Care – PPO | Attending: Orthopaedic Surgery | Admitting: Physical Therapy

## 2023-01-12 ENCOUNTER — Encounter (HOSPITAL_BASED_OUTPATIENT_CLINIC_OR_DEPARTMENT_OTHER): Payer: Self-pay | Admitting: Physical Therapy

## 2023-01-12 DIAGNOSIS — M79652 Pain in left thigh: Secondary | ICD-10-CM | POA: Insufficient documentation

## 2023-01-12 DIAGNOSIS — R262 Difficulty in walking, not elsewhere classified: Secondary | ICD-10-CM | POA: Diagnosis present

## 2023-01-12 DIAGNOSIS — M6281 Muscle weakness (generalized): Secondary | ICD-10-CM | POA: Insufficient documentation
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
## 2023-01-19 ENCOUNTER — Encounter (HOSPITAL_BASED_OUTPATIENT_CLINIC_OR_DEPARTMENT_OTHER): Payer: BC Managed Care – PPO

## 2023-02-02 ENCOUNTER — Ambulatory Visit (HOSPITAL_BASED_OUTPATIENT_CLINIC_OR_DEPARTMENT_OTHER): Payer: BC Managed Care – PPO

## 2023-02-16 ENCOUNTER — Ambulatory Visit (HOSPITAL_BASED_OUTPATIENT_CLINIC_OR_DEPARTMENT_OTHER): Payer: BC Managed Care – PPO | Attending: Orthopaedic Surgery | Admitting: Physical Therapy

## 2023-02-16 DIAGNOSIS — M6281 Muscle weakness (generalized): Secondary | ICD-10-CM | POA: Insufficient documentation

## 2023-02-16 DIAGNOSIS — R262 Difficulty in walking, not elsewhere classified: Secondary | ICD-10-CM | POA: Insufficient documentation

## 2023-02-16 DIAGNOSIS — M79652 Pain in left thigh: Secondary | ICD-10-CM | POA: Insufficient documentation

## 2023-03-02 ENCOUNTER — Ambulatory Visit (HOSPITAL_BASED_OUTPATIENT_CLINIC_OR_DEPARTMENT_OTHER): Payer: BC Managed Care – PPO | Admitting: Physical Therapy

## 2023-03-02 ENCOUNTER — Encounter (HOSPITAL_BASED_OUTPATIENT_CLINIC_OR_DEPARTMENT_OTHER): Payer: Self-pay | Admitting: Physical Therapy

## 2023-03-07 IMAGING — DX DG HAND COMPLETE 3+V*L*
1 series · 3 of 3 positions shown · non-contrast
Comparison: None.

CLINICAL DATA: Trauma to the left hand.

EXAM:
LEFT HAND - COMPLETE 3+ VIEW

[Series 1: hand · 0.14mm/px · 3 of 3 slices shown]
[im 1/3]
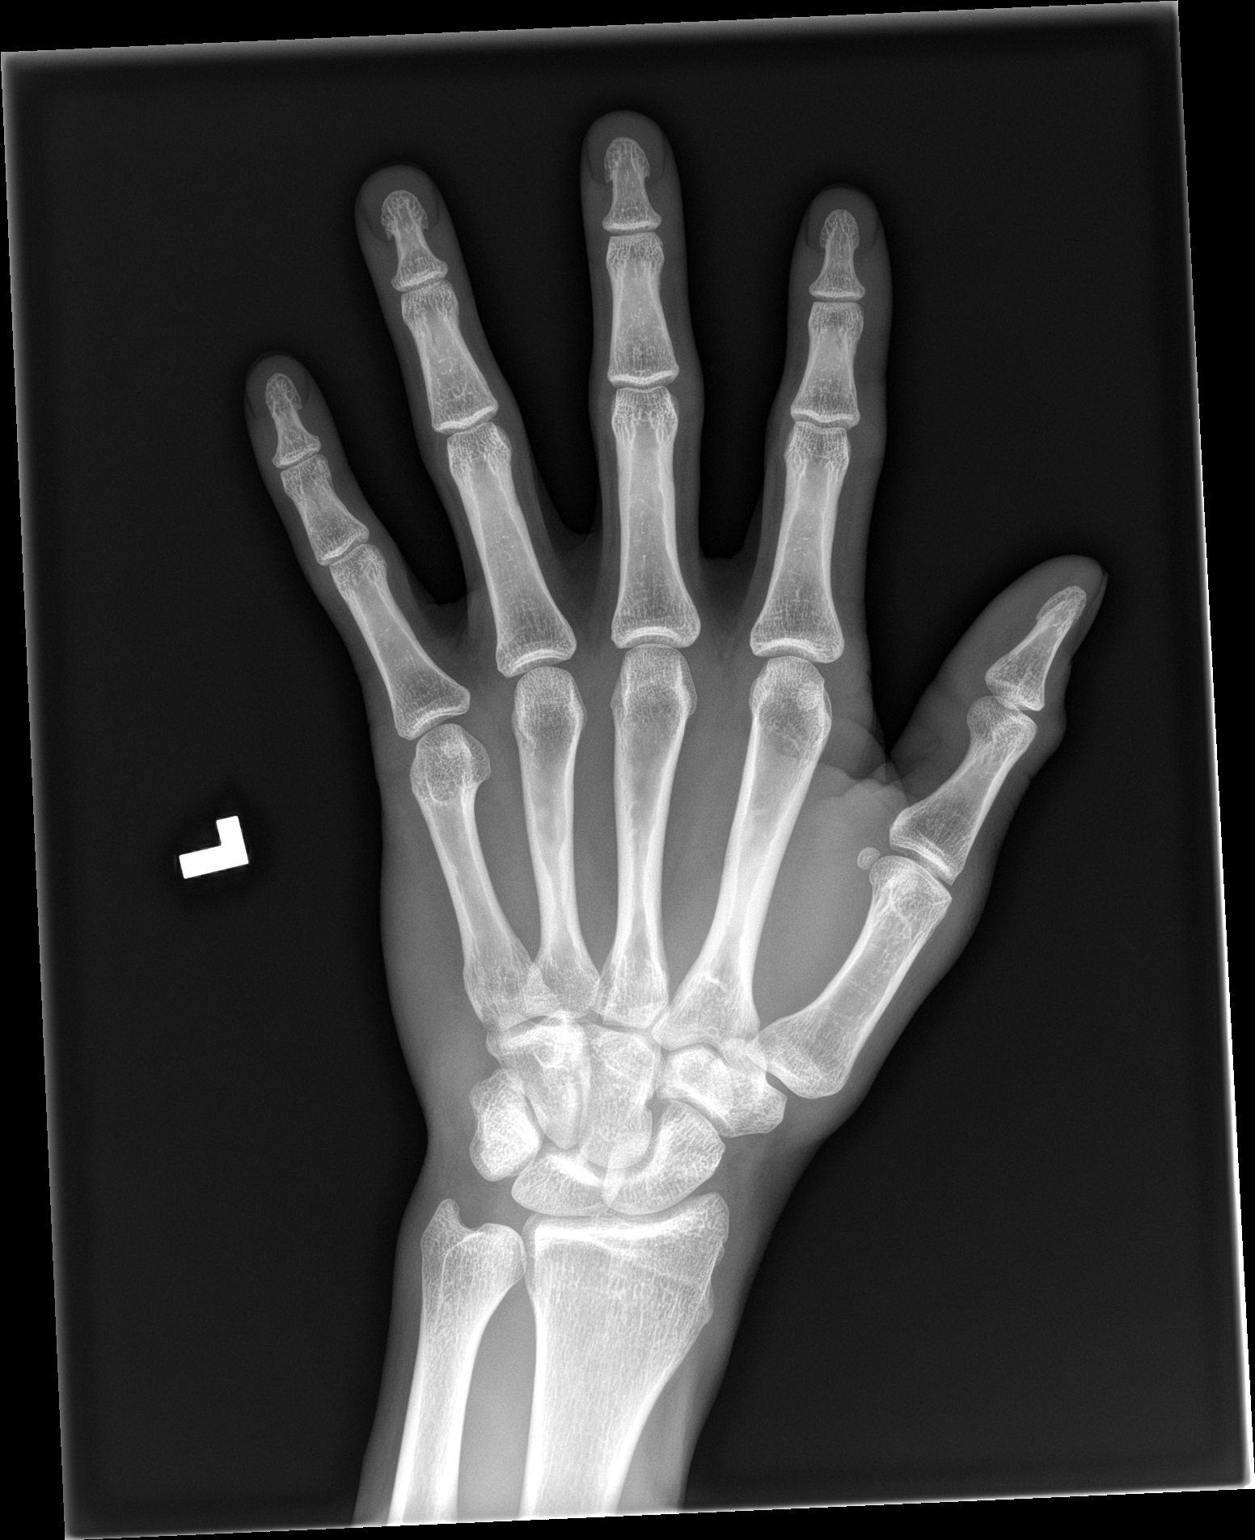
[im 2/3]
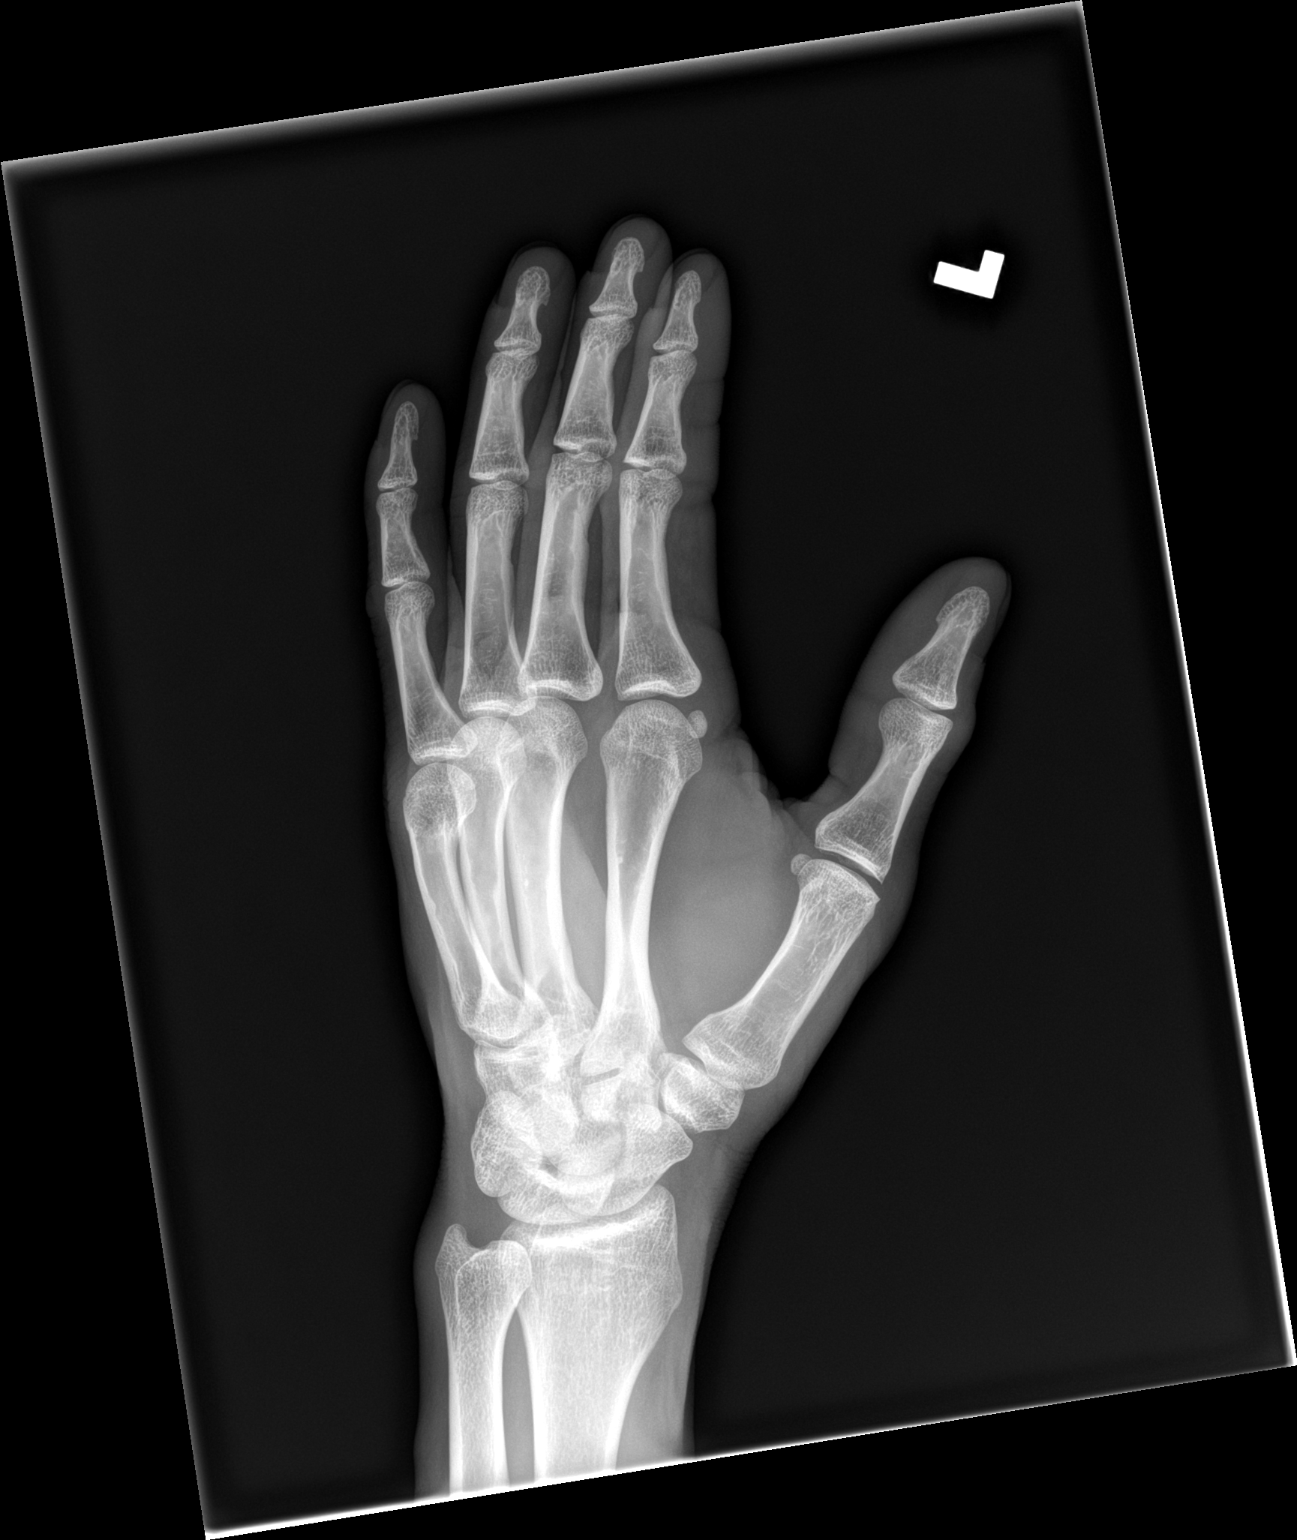
[im 3/3]
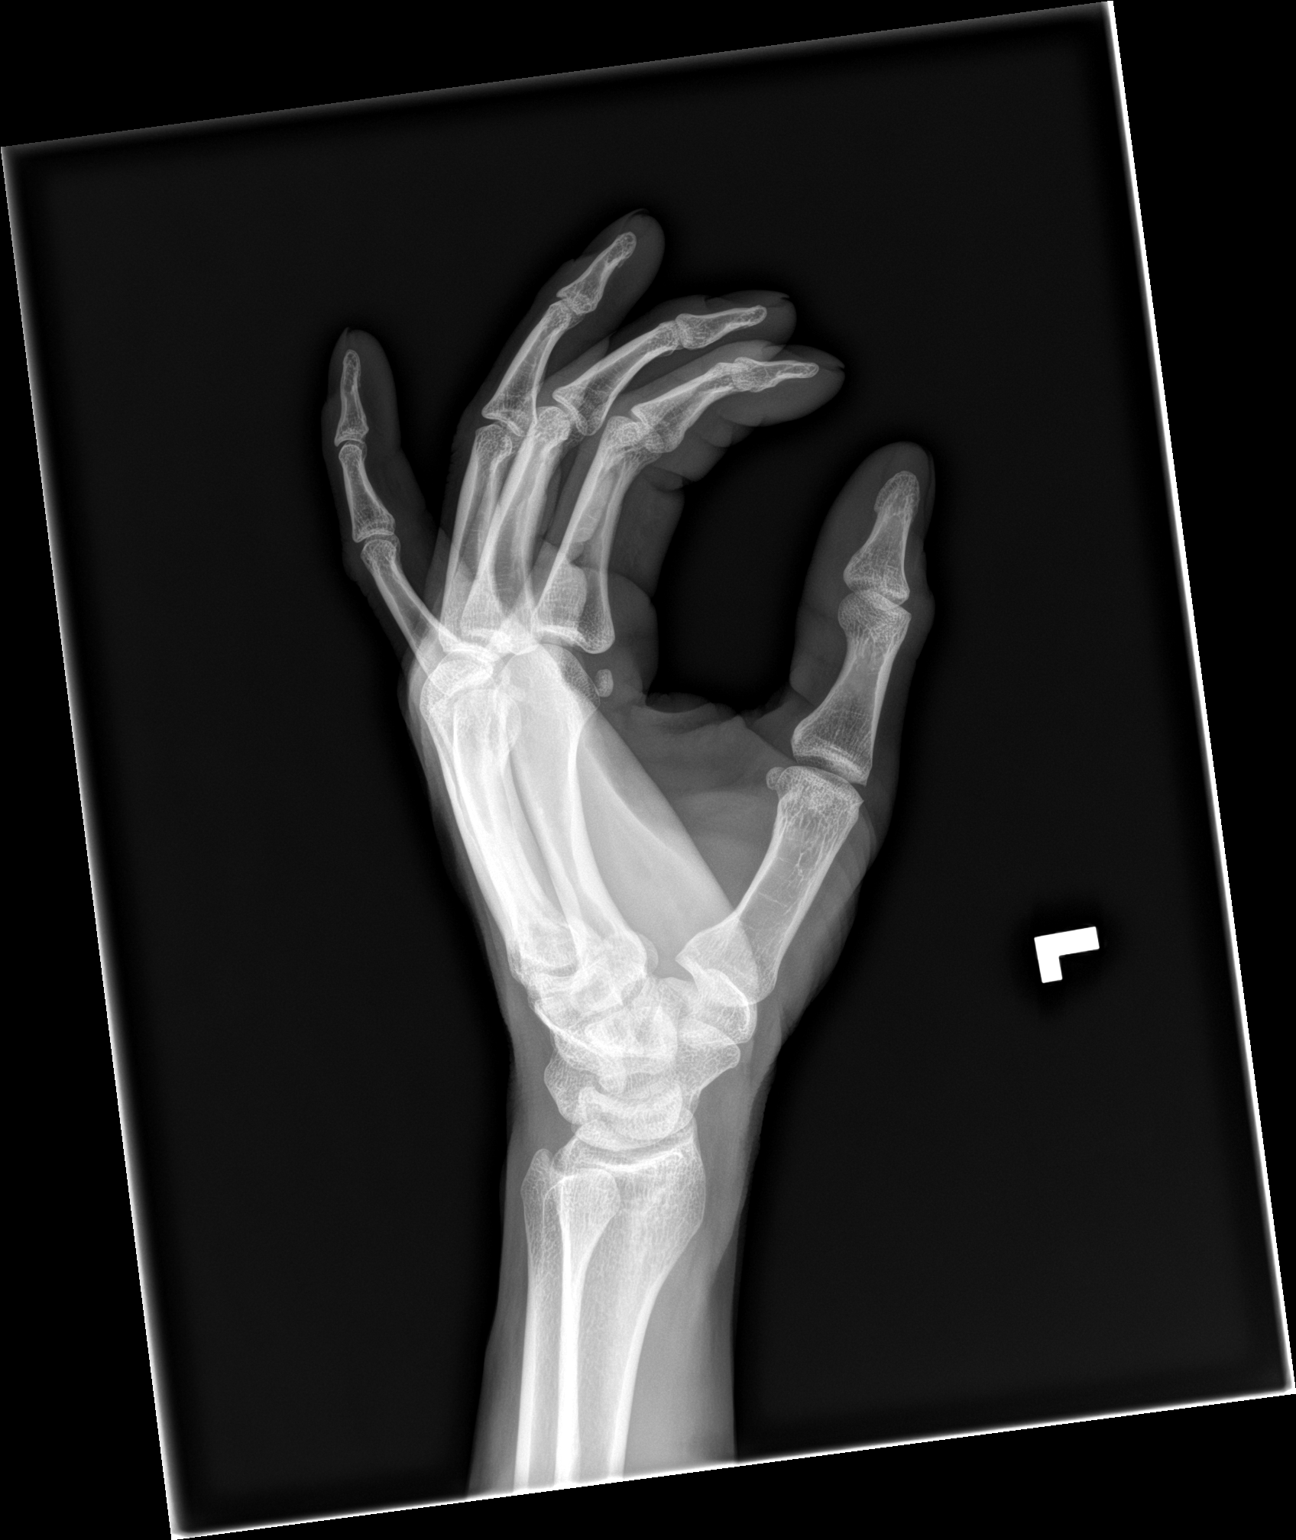

[3 of 3 positions shown; findings below may reference images not displayed]

FINDINGS: There is no evidence of fracture or dislocation. There is no
evidence of arthropathy or other focal bone abnormality. Soft
tissues are unremarkable.
IMPRESSION: Negative.

## 2023-03-16 ENCOUNTER — Encounter (HOSPITAL_BASED_OUTPATIENT_CLINIC_OR_DEPARTMENT_OTHER): Payer: BC Managed Care – PPO | Admitting: Physical Therapy

## 2023-09-03 ENCOUNTER — Other Ambulatory Visit: Payer: Self-pay

## 2023-09-03 ENCOUNTER — Emergency Department (HOSPITAL_COMMUNITY)

## 2023-09-03 ENCOUNTER — Emergency Department (HOSPITAL_COMMUNITY)
Admission: EM | Admit: 2023-09-03 | Discharge: 2023-09-03 | Disposition: A | Discharge status: Home / Self Care | Attending: Emergency Medicine | Admitting: Emergency Medicine

## 2023-09-03 ENCOUNTER — Encounter (HOSPITAL_COMMUNITY): Payer: Self-pay | Admitting: *Deleted

## 2023-09-03 DIAGNOSIS — W01198A Fall on same level from slipping, tripping and stumbling with subsequent striking against other object, initial encounter: Secondary | ICD-10-CM | POA: Diagnosis not present

## 2023-09-03 DIAGNOSIS — Y93E5 Activity, floor mopping and cleaning: Secondary | ICD-10-CM | POA: Insufficient documentation

## 2023-09-03 DIAGNOSIS — S92512A Displaced fracture of proximal phalanx of left lesser toe(s), initial encounter for closed fracture: Secondary | ICD-10-CM | POA: Insufficient documentation

## 2023-09-03 DIAGNOSIS — S99922A Unspecified injury of left foot, initial encounter: Secondary | ICD-10-CM | POA: Diagnosis present

## 2023-09-03 DIAGNOSIS — Z7982 Long term (current) use of aspirin: Secondary | ICD-10-CM | POA: Diagnosis not present

## 2023-09-03 DIAGNOSIS — S92353A Displaced fracture of fifth metatarsal bone, unspecified foot, initial encounter for closed fracture: Secondary | ICD-10-CM

## 2023-09-03 DIAGNOSIS — J45909 Unspecified asthma, uncomplicated: Secondary | ICD-10-CM | POA: Diagnosis not present

## 2023-09-03 DIAGNOSIS — Y92009 Unspecified place in unspecified non-institutional (private) residence as the place of occurrence of the external cause: Secondary | ICD-10-CM | POA: Insufficient documentation

## 2023-09-03 DIAGNOSIS — S92352A Displaced fracture of fifth metatarsal bone, left foot, initial encounter for closed fracture: Secondary | ICD-10-CM | POA: Insufficient documentation

## 2023-09-03 NOTE — ED Notes (Signed)
 CAM boot and crutches provided by ortho tech

## 2023-09-03 NOTE — ED Triage Notes (Signed)
 The pt took a 800 mg approx one hour ago

## 2023-09-03 NOTE — Progress Notes (Signed)
 Orthopedic Tech Progress Note Patient Details:  Michael Hood 01-10-1991 [id number]  Ortho Devices Type of Ortho Device: CAM walker, Crutches Ortho Device/Splint Location: LLE Ortho Device/Splint Interventions: Ordered, Application, Adjustment   Post Interventions Patient Tolerated: Well Instructions Provided: Care of device   Michael Hood 09/03/2023, 5:17 AM

## 2023-09-03 NOTE — Discharge Instructions (Signed)
 You were found to have a fracture of the left fifth metatarsal (long bone in the left part of your foot) and in a portion of the small toe.  Please use the cam walker boot and crutches to reduce weightbearing.  Schedule an appointment with orthopedic surgery for further management as needed.  You may take ibuprofen  or Tylenol  at home for pain control.

## 2023-09-03 NOTE — ED Triage Notes (Signed)
 The pt tripped and fell onto the bed  that caused his foot to hyper extend approx 2 hours ago

## 2023-09-03 NOTE — ED Provider Notes (Signed)
 [facility] AT [facility] Provider Note   CSN: [id number] Arrival date & time: 09/03/23  0156     History  Chief Complaint  Patient presents with   Foot Pain    Michael Hood is a 33 y.o. male.  Patient presents to the emergency department complaining of left-sided foot pain.  He states he was cleaning his house and tripped falling onto his bed and hitting the lateral portion of his left foot under the bed on part of the bed.  He complains of pain near the small toe of the left foot.  He denies other injuries.  Past medical history significant for asthma   Foot Pain       Home Medications Prior to Admission medications   Medication Sig Start Date End Date Taking? Authorizing Provider  aspirin  EC 325 MG tablet Take 1 tablet (325 mg total) by mouth daily. Patient not taking: Reported on 09/02/2022 06/02/22   Wilhelmenia Harada, MD  oxyCODONE  (ROXICODONE ) 5 MG immediate release tablet Take 1 tablet (5 mg total) by mouth every 4 (four) hours as needed for severe pain or breakthrough pain. Patient not taking: Reported on 09/02/2022 06/12/22   Wilhelmenia Harada, MD      Allergies    Patient has no known allergies.    Review of Systems   Review of Systems  Physical Exam Updated Vital Signs BP 118/63   Pulse 86   Temp 98.9 F (37.2 C)   Resp 16   Ht 5\' 7"  (1.702 m)   Wt 59 kg   SpO2 97%   BMI 20.37 kg/m  Physical Exam  ED Results / Procedures / Treatments   Labs (all labs ordered are listed, but only abnormal results are displayed) Labs Reviewed - No data to display  EKG None  Radiology DG Foot Complete Left Result Date: 09/03/2023 CLINICAL DATA:  Status post trauma. EXAM: LEFT FOOT - COMPLETE 3+ VIEW COMPARISON:  None Available. FINDINGS: Acute, nondisplaced fracture deformities are seen involving the distal aspect of the fifth left metatarsal and proximal phalanx of the fifth left toe. There is no evidence of dislocation. There is no  evidence of arthropathy or other focal bone abnormality. Soft tissue swelling and a small superficial soft tissue defect are seen adjacent to the previously noted fracture sites. IMPRESSION: Acute fractures of the fifth left metatarsal and proximal phalanx of the fifth left toe. Electronically Signed   By: Virgle Grime M.D.   On: 09/03/2023 02:42    Procedures .Ortho Injury Treatment  Date/Time: 09/03/2023 4:16 AM  Performed by: Elisa Guest, PA-C Authorized by: Elisa Guest, PA-C   Consent:    Consent obtained:  Verbal   Consent given by:  PatientInjury location: foot Location details: left foot Injury type: fracture Fracture type: fifth metatarsal Pre-procedure neurovascular assessment: neurovascularly intact Immobilization: Cam boot with crutches. Splint Applied by: ED Nurse Post-procedure neurovascular assessment: post-procedure neurovascularly intact       Medications Ordered in ED Medications - No data to display  ED Course/ Medical Decision Making/ A&P                                 Medical Decision Making  This patient presents to the ED for concern of left foot pain, this involves an extensive number of treatment options, and is a complaint that carries with it a high risk of complications and morbidity.  The differential diagnosis includes fracture, dislocation, soft tissue injury, others   Co morbidities / Chronic conditions that complicate the patient evaluation  Asthma   Additional history obtained:  Additional history obtained from EMR   Imaging Studies ordered:  I ordered imaging studies including plain films of the left foot I independently visualized and interpreted imaging which showed  Acute fractures of the fifth left metatarsal and proximal phalanx of  the fifth left toe.   I agree with the radiologist interpretation   Test / Admission - Considered:  Patient with acute fracture of the fifth left metatarsal and proximal  phalanx of the fifth left toe.  Patient placed in cam walker boot and provided with crutches.  Plan to have patient follow-up with orthopedic surgery for further evaluation and management as needed.  Patient will use ibuprofen  and Tylenol  at home for pain control.  Return precautions provided.         Final Clinical Impression(s) / ED Diagnoses Final diagnoses:  Closed fracture of fifth metatarsal bone, initial encounter    Rx / DC Orders ED Discharge Orders     None         Delories Fetter 09/03/23 0419    Lindle Rhea, MD 09/04/23 [phone]

## 2023-09-04 ENCOUNTER — Encounter (HOSPITAL_BASED_OUTPATIENT_CLINIC_OR_DEPARTMENT_OTHER): Payer: Self-pay | Admitting: Student

## 2023-09-04 ENCOUNTER — Ambulatory Visit (INDEPENDENT_AMBULATORY_CARE_PROVIDER_SITE_OTHER): Admitting: Student

## 2023-09-04 DIAGNOSIS — S92515A Nondisplaced fracture of proximal phalanx of left lesser toe(s), initial encounter for closed fracture: Secondary | ICD-10-CM | POA: Diagnosis not present

## 2023-09-04 DIAGNOSIS — S92352A Displaced fracture of fifth metatarsal bone, left foot, initial encounter for closed fracture: Secondary | ICD-10-CM | POA: Diagnosis not present

## 2023-09-04 NOTE — Progress Notes (Signed)
 Chief Complaint: Left foot fracture     History of Present Illness:    Michael Hood is a 33 y.o. male presenting to clinic today for evaluation of fractures in his left foot.  Patient reports that yesterday he fell while cleaning in his daughter's room and hit his foot on the side of the bed.  He immediately developed pain, swelling, and difficulty weightbearing.  He was evaluated in the emergency room yesterday and was given crutches and a cam boot after x-rays demonstrated fractures within the foot.  He has been taking 800 mg ibuprofen  as needed for pain.  No surgical history of the left foot, although he states that a surgery was previously recommended for a bunionette.   Surgical History:   None  PMH/PSH/Family History/Social History/Meds/Allergies:    Past Medical History:  Diagnosis Date   Asthma    as a child   Retained orthopedic hardware 03/2012   left hand   Past Surgical History:  Procedure Laterality Date   HARDWARE REMOVAL  03/17/2012   Procedure: HARDWARE REMOVAL;  Surgeon: Kemp Patter, MD;  Location: [facility];  Service: Orthopedics;  Laterality: Left;  Removal IM Rod Left hand    INTRAMEDULLARY (IM) NAIL INTERTROCHANTERIC Left 06/01/2022   Procedure: INTRAMEDULLARY (IM) NAIL INTERTROCHANTERIC;  Surgeon: Wilhelmenia Harada, MD;  Location: [facility] OR;  Service: Orthopedics;  Laterality: Left;   PERCUTANEOUS PINNING METACARPAL FRACTURE  01/05/2012   left 5th   Social History   Socioeconomic History   Marital status: Single    Spouse name: Not on file   Number of children: Not on file   Years of education: Not on file   Highest education level: Not on file  Occupational History   Not on file  Tobacco Use   Smoking status: Never   Smokeless tobacco: Never  Vaping Use   Vaping status: Never Used  Substance and Sexual Activity   Alcohol use: Yes    Comment: 2 x/week   Drug use: No   Sexual activity: Not on  file  Other Topics Concern   Not on file  Social History Narrative   ** Merged History Encounter **       Social Drivers of Health   Financial Resource Strain: Not on file  Food Insecurity: Not on file  Transportation Needs: Not on file  Physical Activity: Not on file  Stress: Not on file  Social Connections: Unknown (07/21/2022)   Received from [facility], [facility]   Social Network    Social Network: Not on file   Family History  Problem Relation Age of Onset   Healthy Mother    Healthy Father    No Known Allergies Current Outpatient Medications  Medication Sig Dispense Refill   aspirin  EC 325 MG tablet Take 1 tablet (325 mg total) by mouth daily. (Patient not taking: Reported on 09/02/2022) 30 tablet 0   oxyCODONE  (ROXICODONE ) 5 MG immediate release tablet Take 1 tablet (5 mg total) by mouth every 4 (four) hours as needed for severe pain or breakthrough pain. (Patient not taking: Reported on 09/02/2022) 20 tablet 0   No current facility-administered medications for this visit.   DG Foot Complete Left Result Date: 09/03/2023 CLINICAL DATA:  Status post trauma. EXAM: LEFT FOOT - COMPLETE 3+ VIEW COMPARISON:  None Available. FINDINGS: Acute, nondisplaced fracture deformities are seen involving the distal aspect of the fifth left metatarsal and proximal phalanx of the fifth left toe. There is no evidence of dislocation. There is no evidence of arthropathy or other focal bone abnormality. Soft tissue swelling and a small superficial soft tissue defect are seen adjacent to the previously noted fracture sites. IMPRESSION: Acute fractures of the fifth left metatarsal and proximal phalanx of the fifth left toe. Electronically Signed   By: Virgle Grime M.D.   On: 09/03/2023 02:42    Review of Systems:   A ROS was performed including pertinent positives and negatives as documented in the HPI.  Physical Exam :   Constitutional: NAD and appears stated age Neurological: Alert  and oriented Psych: Appropriate affect and cooperative There were no vitals taken for this visit.   Comprehensive Musculoskeletal Exam:    Tenderness and moderate swelling noted over the dorsal aspect of the distal fifth metatarsal and proximal phalanx.  Flexor and extensor mechanisms of the fifth toe are intact.  No other tenderness throughout the left foot or ankle.  Dorsalis pedis pulse 2+.  Distal neurosensory exam intact.  Imaging:   Xray review from 09/03/2023 (left foot 3 views): Acute minimally angulated oblique fracture of the fifth proximal phalanx.  Displaced fracture of the fifth metatarsal head.   I personally reviewed and interpreted the radiographs.   Assessment:   33 y.o. male with acute fractures in the left foot of the proximal phalanx and metatarsal head.  The proximal phalanx appears to demonstrate minimal displacement and mild displacement of the fifth metatarsal head.  Given the appearance of these fractures today, I believe this can be managed nonoperatively.  We will plan to continue immobilization in the walking boot as he is tolerating this well.  Will begin slow progression back into weightbearing as tolerated.  He can continue ibuprofen  or Tylenol  as needed for pain control.  I will plan to see him back another 3 weeks to assess healing on x-rays.  Plan :    - Return to clinic in 3 weeks for repeat x-ray and reassessment     I personally saw and evaluated the patient, and participated in the management and treatment plan.  Sharrell Deck, PA-C Orthopedics

## 2023-09-24 ENCOUNTER — Ambulatory Visit (HOSPITAL_BASED_OUTPATIENT_CLINIC_OR_DEPARTMENT_OTHER): Admitting: Student

## 2024-02-08 ENCOUNTER — Encounter: Payer: Self-pay | Admitting: Radiology
